# Patient Record
Sex: Male | Born: 1940 | Race: White | Hispanic: No | Marital: Married | Smoking: Never smoker
Health system: Southern US, Community
[De-identification: ages and names within clinical notes are randomized; demographics above are authoritative.]

## PROBLEM LIST (undated history)

## (undated) DIAGNOSIS — I499 Cardiac arrhythmia, unspecified: Secondary | ICD-10-CM

## (undated) DIAGNOSIS — J45909 Unspecified asthma, uncomplicated: Secondary | ICD-10-CM

## (undated) DIAGNOSIS — G4730 Sleep apnea, unspecified: Secondary | ICD-10-CM

## (undated) DIAGNOSIS — M1990 Unspecified osteoarthritis, unspecified site: Secondary | ICD-10-CM

## (undated) DIAGNOSIS — I10 Essential (primary) hypertension: Secondary | ICD-10-CM

## (undated) DIAGNOSIS — Z955 Presence of coronary angioplasty implant and graft: Secondary | ICD-10-CM

## (undated) DIAGNOSIS — I4891 Unspecified atrial fibrillation: Secondary | ICD-10-CM

## (undated) DIAGNOSIS — E785 Hyperlipidemia, unspecified: Secondary | ICD-10-CM

## (undated) DIAGNOSIS — I219 Acute myocardial infarction, unspecified: Secondary | ICD-10-CM

## (undated) HISTORY — DX: Acute myocardial infarction, unspecified: I21.9

## (undated) HISTORY — DX: Hyperlipidemia, unspecified: E78.5

## (undated) HISTORY — DX: Essential (primary) hypertension: I10

## (undated) HISTORY — DX: Sleep apnea, unspecified: G47.30

## (undated) HISTORY — DX: Unspecified atrial fibrillation: I48.91

## (undated) HISTORY — DX: Presence of coronary angioplasty implant and graft: Z95.5

## (undated) HISTORY — PX: CARDIAC SURGERY: SHX584

## (undated) HISTORY — DX: Unspecified asthma, uncomplicated: J45.909

## (undated) HISTORY — PX: CORONARY ARTERY BYPASS GRAFT: SHX141

## (undated) HISTORY — DX: Unspecified osteoarthritis, unspecified site: M19.90

---

## 1979-08-16 DIAGNOSIS — C4490 Unspecified malignant neoplasm of skin, unspecified: Secondary | ICD-10-CM

## 1979-08-16 HISTORY — DX: Unspecified malignant neoplasm of skin, unspecified: C44.90

## 1995-12-16 DIAGNOSIS — Z5189 Encounter for other specified aftercare: Secondary | ICD-10-CM

## 1995-12-16 HISTORY — DX: Encounter for other specified aftercare: Z51.89

## 1995-12-16 HISTORY — PX: CHOLECYSTECTOMY: SHX55

## 1996-08-23 ENCOUNTER — Inpatient Hospital Stay
Admission: AD | Admit: 1996-08-23 | Disposition: A | Discharge status: Home / Self Care | Payer: Self-pay | Source: Ambulatory Visit | Admitting: Cardiology

## 1996-09-30 ENCOUNTER — Emergency Department: Admit: 1996-09-30 | Payer: Self-pay | Admitting: Surgery

## 1996-10-01 ENCOUNTER — Inpatient Hospital Stay
Admission: EM | Admit: 1996-10-01 | Disposition: A | Discharge status: Home / Self Care | Payer: Self-pay | Admitting: Surgery

## 1996-11-07 ENCOUNTER — Ambulatory Visit: Admission: RE | Admit: 1996-11-07 | Payer: Self-pay | Source: Ambulatory Visit | Admitting: Gastroenterology

## 1996-11-28 ENCOUNTER — Ambulatory Visit
Admission: RE | Admit: 1996-11-28 | Disposition: A | Discharge status: Home / Self Care | Payer: Self-pay | Source: Ambulatory Visit | Admitting: Specialist

## 1997-02-03 ENCOUNTER — Ambulatory Visit: Admit: 1997-02-03 | Disposition: A | Discharge status: Home / Self Care | Payer: Self-pay | Admitting: Internal Medicine

## 1999-11-16 ENCOUNTER — Ambulatory Visit: Admit: 1999-11-16 | Disposition: A | Discharge status: Home / Self Care | Payer: Self-pay | Admitting: Adult Health

## 1999-11-19 ENCOUNTER — Emergency Department: Admit: 1999-11-19 | Payer: Self-pay | Admitting: Emergency Medicine

## 1999-11-21 ENCOUNTER — Ambulatory Visit
Admit: 1999-11-21 | Disposition: A | Discharge status: Home / Self Care | Payer: Self-pay | Admitting: Physical Medicine & Rehabilitation

## 1999-11-22 ENCOUNTER — Ambulatory Visit: Admit: 1999-11-22 | Disposition: A | Discharge status: Home / Self Care | Payer: Self-pay

## 1999-12-02 ENCOUNTER — Ambulatory Visit: Admit: 1999-12-02 | Disposition: A | Discharge status: Home / Self Care | Payer: Self-pay

## 2003-04-26 ENCOUNTER — Ambulatory Visit
Admission: RE | Admit: 2003-04-26 | Disposition: A | Discharge status: Home / Self Care | Payer: Self-pay | Source: Ambulatory Visit | Admitting: Gastroenterology

## 2004-09-05 ENCOUNTER — Ambulatory Visit
Admission: AD | Admit: 2004-09-05 | Disposition: A | Discharge status: Home / Self Care | Payer: Self-pay | Source: Ambulatory Visit | Admitting: Internal Medicine

## 2004-09-14 ENCOUNTER — Ambulatory Visit
Admission: AD | Admit: 2004-09-14 | Disposition: A | Discharge status: Home / Self Care | Payer: Self-pay | Source: Ambulatory Visit | Admitting: Internal Medicine

## 2005-12-15 DIAGNOSIS — E119 Type 2 diabetes mellitus without complications: Secondary | ICD-10-CM

## 2005-12-15 HISTORY — PX: COLONOSCOPY: SHX174

## 2005-12-15 HISTORY — DX: Type 2 diabetes mellitus without complications: E11.9

## 2006-12-23 ENCOUNTER — Ambulatory Visit
Admission: RE | Admit: 2006-12-23 | Disposition: A | Discharge status: Home / Self Care | Payer: Self-pay | Source: Ambulatory Visit | Admitting: Internal Medicine

## 2008-12-15 DIAGNOSIS — N200 Calculus of kidney: Secondary | ICD-10-CM

## 2008-12-15 HISTORY — PX: OTHER SURGICAL HISTORY: SHX169

## 2008-12-15 HISTORY — PX: JOINT REPLACEMENT: SHX530

## 2008-12-15 HISTORY — DX: Calculus of kidney: N20.0

## 2009-03-23 ENCOUNTER — Inpatient Hospital Stay
Admission: RE | Admit: 2009-03-23 | Disposition: A | Discharge status: Home / Self Care | Payer: Self-pay | Source: Ambulatory Visit | Admitting: Orthopaedic Surgery

## 2009-03-26 LAB — CBC AND DIFFERENTIAL
BASOPHILS %: 0.2 % (ref 0.0–2.0)
BASOPHILS %: 0.3 % (ref 0.0–2.0)
Baso(Absolute): 0.02 10*3/uL (ref 0.00–0.20)
Baso(Absolute): 0.03 10*3/uL (ref 0.00–0.20)
Eosinophils %: 0.1 % (ref 0.0–6.0)
Eosinophils %: 0.6 % (ref 0.0–6.0)
Eosinophils Absolute: 0.01 10*3/uL — ABNORMAL LOW (ref 0.10–0.30)
Eosinophils Absolute: 0.07 10*3/uL — ABNORMAL LOW (ref 0.10–0.30)
Hematocrit: 37.7 % — ABNORMAL LOW (ref 39.0–49.0)
Hematocrit: 34.6 % — ABNORMAL LOW (ref 39.0–49.0)
Hemoglobin: 12.9 g/dL — ABNORMAL LOW (ref 13.2–17.3)
Hemoglobin: 11.7 g/dL — ABNORMAL LOW (ref 13.2–17.3)
Immature Granulocytes #: 0.02 10*3/uL (ref 0.00–0.05)
Immature Granulocytes #: 0.03 10*3/uL (ref 0.00–0.05)
Immature Granulocytes %: 0.2 % — ABNORMAL HIGH (ref 0.0–0.0)
Immature Granulocytes %: 0.3 % — ABNORMAL HIGH (ref 0.0–0.0)
Lymphocytes Absolute: 1.48 10*3/uL (ref 1.00–4.80)
Lymphocytes Absolute: 2.75 10*3/uL (ref 1.00–4.80)
Lymphocytes Relative: 14.3 % — ABNORMAL LOW (ref 25.0–55.0)
Lymphocytes Relative: 23.7 % — ABNORMAL LOW (ref 25.0–55.0)
MCH: 30.6 pg (ref 27.0–34.0)
MCH: 30.5 pg (ref 27.0–34.0)
MCHC: 34.2 g/dL (ref 32.0–36.0)
MCHC: 33.8 g/dL (ref 32.0–36.0)
MCV: 89.3 fL (ref 80–100)
MCV: 90.3 fL (ref 80–100)
MPV: 9.5 fL (ref 9.0–13.0)
MPV: 9.8 fL (ref 9.0–13.0)
Monocytes Absolute: 1.02 10*3/uL (ref 0.10–1.20)
Monocytes Absolute: 1.48 10*3/uL — ABNORMAL HIGH (ref 0.10–1.20)
Monocytes Relative %: 9.9 % — ABNORMAL HIGH (ref 1.0–8.0)
Monocytes Relative %: 12.7 % — ABNORMAL HIGH (ref 1.0–8.0)
Neutrophils Absolute: 7.82 10*3/uL — ABNORMAL HIGH (ref 1.80–7.70)
Neutrophils Absolute: 7.29 10*3/uL (ref 1.80–7.70)
Neutrophils Relative %: 75.5 % — ABNORMAL HIGH (ref 49.0–69.0)
Neutrophils Relative %: 62.7 % (ref 49.0–69.0)
Nucleated RBC %: 0 /100WBC (ref 0.0–0.0)
Nucleated RBC %: 0 /100WBC (ref 0.0–0.0)
Nucleted RBC #: 0 10*3/uL (ref 0.00–0.00)
Nucleted RBC #: 0 10*3/uL (ref 0.00–0.00)
Platelets: 239 10*3/uL (ref 150–400)
Platelets: 226 10*3/uL (ref 150–400)
RBC: 4.22 M/uL (ref 3.80–5.40)
RBC: 3.83 M/uL (ref 3.80–5.40)
RDW: 12.9 % (ref 11.0–14.0)
RDW: 13 % (ref 11.0–14.0)
WBC: 10.35 10*3/uL (ref 4.80–10.80)
WBC: 11.62 10*3/uL — ABNORMAL HIGH (ref 4.80–10.80)

## 2009-03-26 LAB — BASIC METABOLIC PANEL
BUN / Creatinine Ratio: 21 — ABNORMAL HIGH (ref 8–20)
BUN: 21 mg/dL — ABNORMAL HIGH (ref 6–20)
CO2: 30 mmol/L (ref 21.0–31.0)
Calcium: 8.3 mg/dL — ABNORMAL LOW (ref 8.4–10.2)
Chloride: 96 mmol/L — ABNORMAL LOW (ref 101–111)
Creatinine: 0.99 mg/dL (ref 0.66–1.25)
EGFR: >60 mL/min/{1.73_m2}
EGFR: >60 mL/min/{1.73_m2}
Glucose: 154 mg/dL — ABNORMAL HIGH (ref 70–100)
Potassium: 3.8 mmol/L (ref 3.6–5.0)
Sodium: 133 mmol/L — ABNORMAL LOW (ref 135–145)

## 2009-03-26 LAB — COMPREHENSIVE METABOLIC PANEL
ALT: 34 U/L (ref 7–56)
AST (SGOT): 25 U/L (ref 5–40)
Albumin, Synovial: 3.7 g/dL — ABNORMAL LOW (ref 3.9–5.0)
Alkaline Phosphatase: 75 U/L (ref 38–126)
BUN / Creatinine Ratio: 15 (ref 8–20)
BUN: 13 mg/dL (ref 6–20)
Bilirubin, Total: 1 mg/dL (ref 0.2–1.3)
CO2: 29 mmol/L (ref 21.0–31.0)
Calcium: 8.5 mg/dL (ref 8.4–10.2)
Chloride: 99 mmol/L — ABNORMAL LOW (ref 101–111)
Creatinine: 0.92 mg/dL (ref 0.66–1.25)
EGFR: >60 mL/min/{1.73_m2}
EGFR: >60 mL/min/{1.73_m2}
Glucose: 153 mg/dL — ABNORMAL HIGH (ref 70–100)
Potassium: 3.8 mmol/L (ref 3.6–5.0)
Protein, Total: 6.5 g/dL (ref 6.3–8.2)
Sodium: 135 mmol/L (ref 135–145)

## 2009-03-26 LAB — ABO/RH: ABO Rh: O NEG

## 2009-03-26 LAB — ANTIBODY SCREEN: AB Screen Gel: NEGATIVE

## 2009-12-15 DIAGNOSIS — I2510 Atherosclerotic heart disease of native coronary artery without angina pectoris: Secondary | ICD-10-CM

## 2009-12-15 HISTORY — DX: Atherosclerotic heart disease of native coronary artery without angina pectoris: I25.10

## 2010-11-14 HISTORY — PX: CORONARY ANGIOPLASTY WITH STENT PLACEMENT: SHX49

## 2010-11-17 ENCOUNTER — Emergency Department
Admission: EM | Admit: 2010-11-17 | Disposition: A | Discharge status: Home / Self Care | Payer: Self-pay | Source: Emergency Department | Admitting: Emergency Medicine

## 2010-11-20 LAB — COMPREHENSIVE METABOLIC PANEL
ALT: 26 U/L (ref 7–56)
AST (SGOT): 21 U/L (ref 5–40)
Albumin, Synovial: 4 g/dL (ref 3.9–5.0)
Alkaline Phosphatase: 78 U/L (ref 38–126)
BUN / Creatinine Ratio: 15 (ref 8–20)
BUN: 26 mg/dL — ABNORMAL HIGH (ref 6–20)
Bilirubin, Total: 0.6 mg/dL (ref 0.2–1.3)
CO2: 27 mmol/L (ref 21.0–31.0)
Calcium: 9.3 mg/dL (ref 8.4–10.2)
Chloride: 99 mmol/L — ABNORMAL LOW (ref 101–111)
Creatinine: 1.68 mg/dL — ABNORMAL HIGH (ref 0.66–1.25)
EGFR: 40 mL/min/{1.73_m2}
EGFR: 49 mL/min/{1.73_m2}
Glucose: 141 mg/dL — ABNORMAL HIGH (ref 70–100)
Potassium: 4 mmol/L (ref 3.6–5.0)
Protein, Total: 6.9 g/dL (ref 6.3–8.2)
Sodium: 135 mmol/L (ref 135–145)

## 2010-11-20 LAB — CBC AND DIFFERENTIAL
BASOPHILS %: 0.3 % (ref 0.0–2.0)
Baso(Absolute): 0.04 10*3/uL (ref 0.00–0.20)
Eosinophils %: 0.8 % (ref 0.0–6.0)
Eosinophils Absolute: 0.1 10*3/uL (ref 0.10–0.30)
Hematocrit: 40.3 % (ref 39.0–49.0)
Hemoglobin: 14 g/dL (ref 13.2–17.3)
Immature Granulocytes #: 0.03 10*3/uL (ref 0.00–0.05)
Immature Granulocytes %: 0.2 % — ABNORMAL HIGH (ref 0.0–0.0)
Lymphocytes Absolute: 1.84 10*3/uL (ref 1.00–4.80)
Lymphocytes Relative: 14.8 % — ABNORMAL LOW (ref 25.0–55.0)
MCH: 30.4 pg (ref 27.0–34.0)
MCHC: 34.7 g/dL (ref 32.0–36.0)
MCV: 87.6 fL (ref 80–100)
MPV: 9.6 fL (ref 9.0–13.0)
Monocytes Absolute: 0.87 10*3/uL (ref 0.10–1.20)
Monocytes Relative %: 7 % (ref 1.0–8.0)
Neutrophils Absolute: 9.55 10*3/uL — ABNORMAL HIGH (ref 1.80–7.70)
Neutrophils Relative %: 77.1 % — ABNORMAL HIGH (ref 49.0–69.0)
Nucleated RBC %: 0 /100WBC (ref 0.0–0.0)
Nucleted RBC #: 0 10*3/uL (ref 0.00–0.00)
Platelets: 316 10*3/uL (ref 150–400)
RBC: 4.6 M/uL (ref 3.80–5.40)
RDW: 13.6 % (ref 11.0–14.0)
WBC: 12.4 10*3/uL — ABNORMAL HIGH (ref 4.80–10.80)

## 2010-11-20 LAB — URINALYSIS
Bilirubin, UA: NEGATIVE
Glucose, UA: NEGATIVE
Ketones UA: NEGATIVE
Leukocyte Esterase, UA: NEGATIVE
Nitrate: NEGATIVE
Protein, UR: NEGATIVE
Specific Gravity, UR: 1.025 (ref 1.000–1.035)
Urobilinogen, UA: NORMAL
pH, Urine: 6 (ref 5.0–8.0)

## 2010-11-20 LAB — URINALYSIS WITH MICROSCOPIC

## 2011-03-21 ENCOUNTER — Inpatient Hospital Stay
Admission: RE | Admit: 2011-03-21 | Disposition: A | Discharge status: Home / Self Care | Payer: Self-pay | Source: Ambulatory Visit | Attending: Cardiovascular Disease | Admitting: Cardiovascular Disease

## 2011-04-15 ENCOUNTER — Inpatient Hospital Stay
Admission: RE | Admit: 2011-04-15 | Disposition: A | Discharge status: Home / Self Care | Payer: Self-pay | Source: Ambulatory Visit | Attending: Cardiovascular Disease | Admitting: Cardiovascular Disease

## 2011-05-16 ENCOUNTER — Inpatient Hospital Stay
Admission: RE | Admit: 2011-05-16 | Disposition: A | Discharge status: Home / Self Care | Payer: Self-pay | Source: Ambulatory Visit | Attending: Cardiovascular Disease | Admitting: Cardiovascular Disease

## 2011-06-09 IMAGING — CR DG CHEST 2V
2 series · 2 of 2 positions shown · non-contrast
Comparison: None.

CLINICAL DATA: Chest pain

CHEST - 2 VIEW

[w chest pa *]
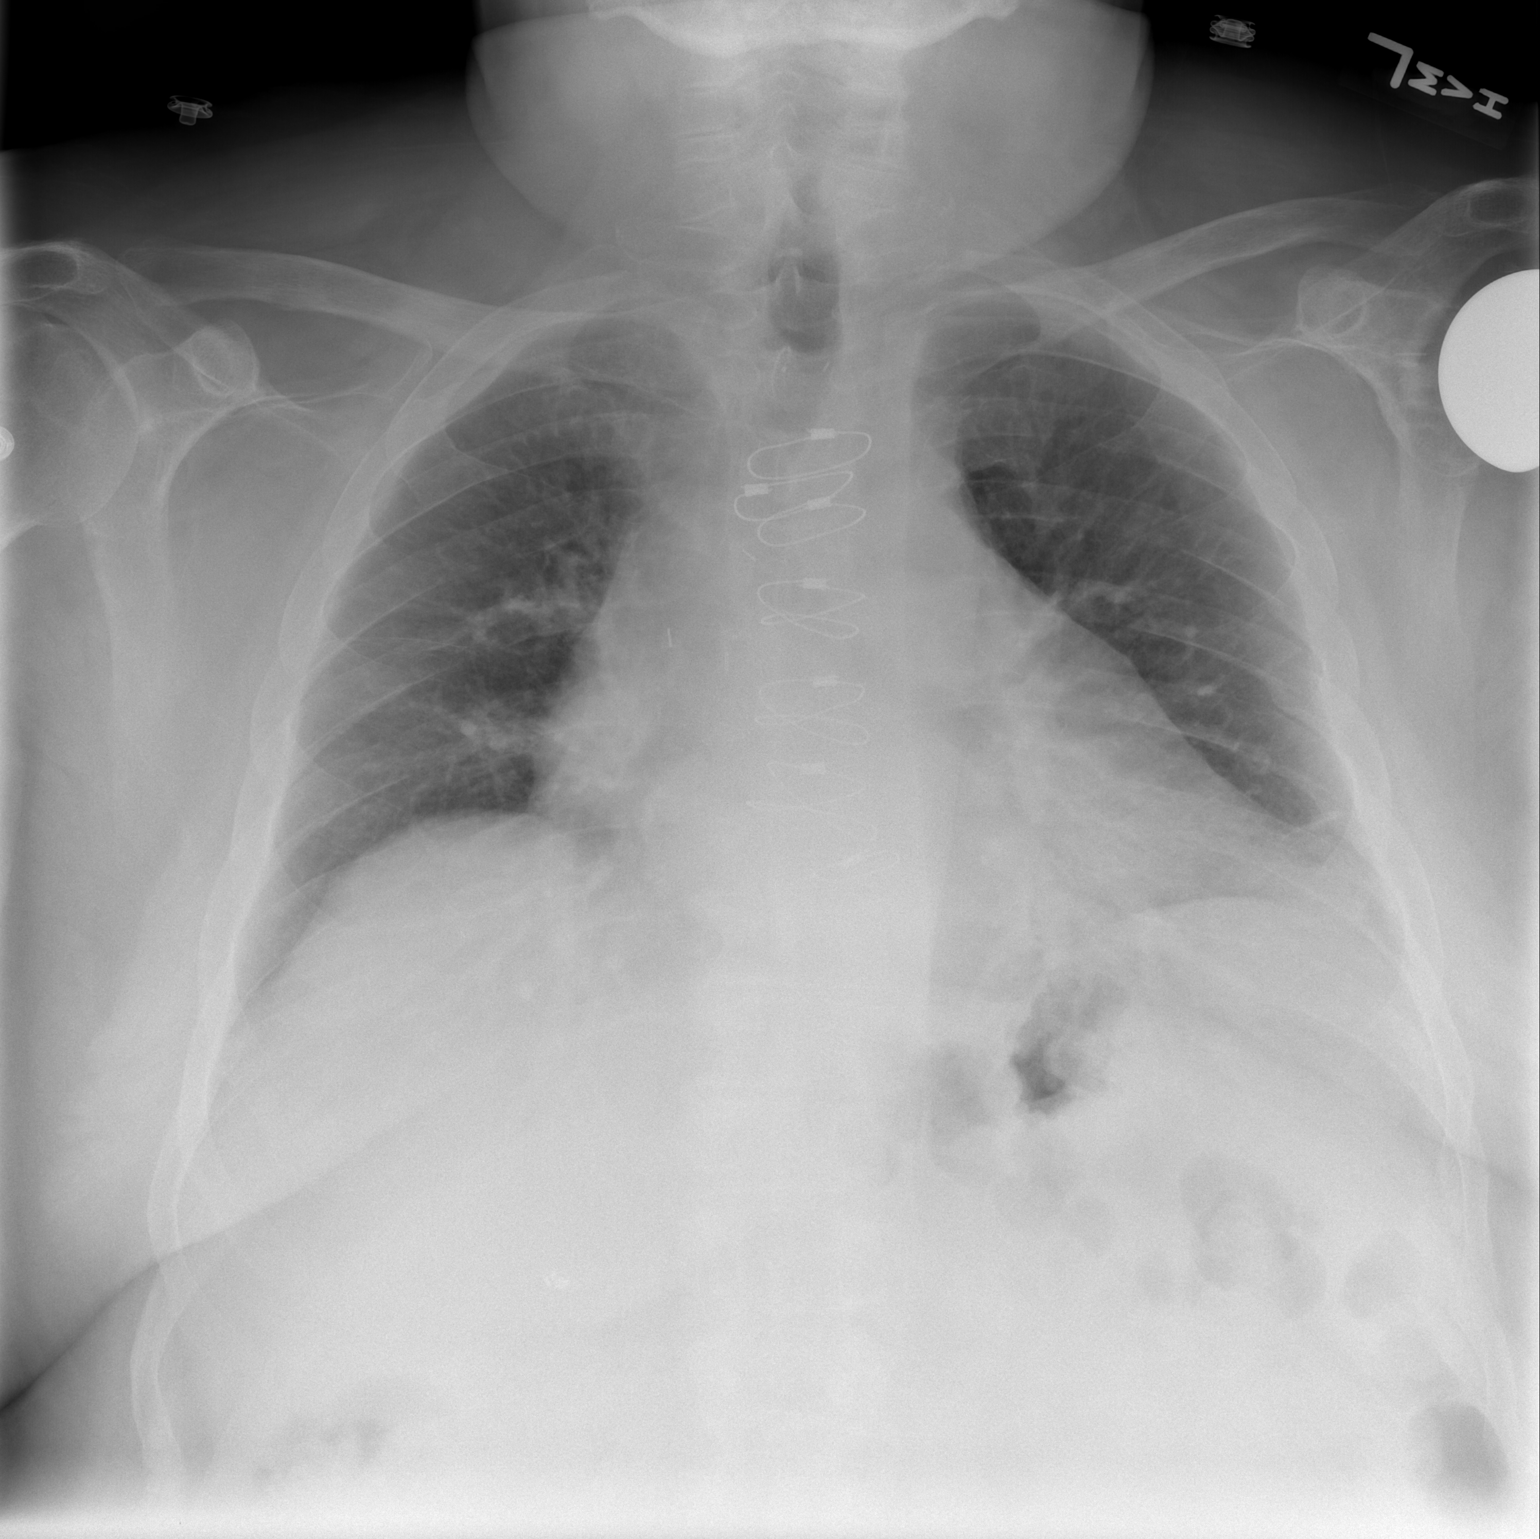

[w chest lat *]
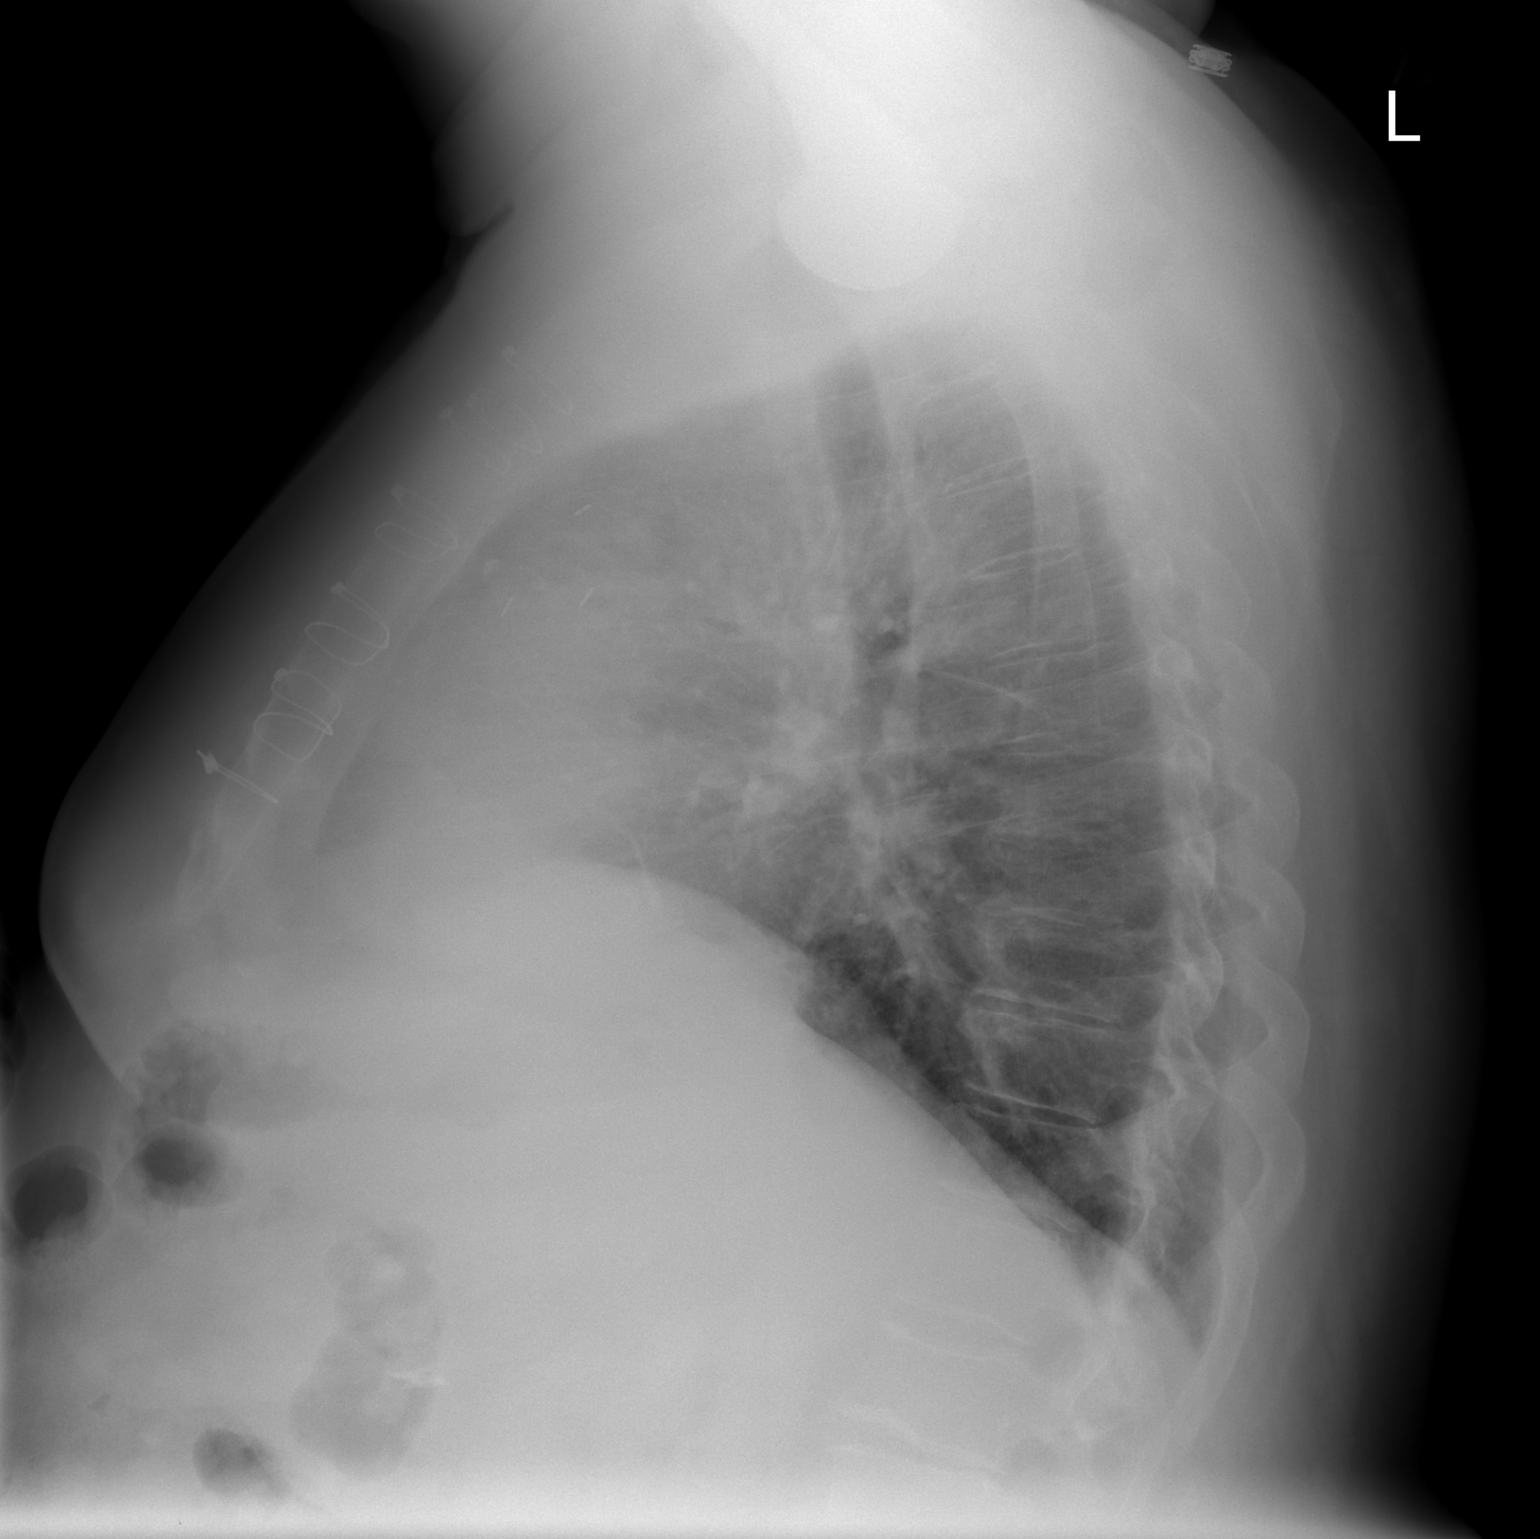

[2 of 2 positions shown; findings below may reference images not displayed]

FINDINGS: Previous coronary bypass changes and left shoulder
arthroplasty.  Cardiac silhouette is enlarged with decreased lung
volumes.  Scattered perihilar and lower lobe atelectasis.  Negative
for CHF, pneumonia, effusion or pneumothorax.  Slight degenerative
changes of the spine.  Previous cholecystectomy noted.
IMPRESSION: Cardiomegaly with scattered atelectasis and low lung volumes.

## 2011-06-10 IMAGING — CT CT ANGIO CHEST
2 of 6 series · 18 of 36 positions shown · IV contrast (APPLIED)
Comparison: Chest radiograph performed 12/07/2010

CLINICAL DATA: Chest and back pressure with exertion for 2 weeks.
Recently status post stem cell procedure at the left knee.

CT ANGIOGRAPHY CHEST WITH CONTRAST
TECHNIQUE: Multidetector CT imaging of the chest was performed
using the standard protocol during bolus administration of
intravenous contrast.  Multiplanar CT image reconstructions
including MIPs were obtained to evaluate the vascular anatomy.
Contrast:  100 mL of Omnipaque 300 IV contrast

[Series 6: thins · axial · 0.80mm/px · z∈[-312,-119]mm · 17 of 215 slices shown]
[im 11/215  lung]
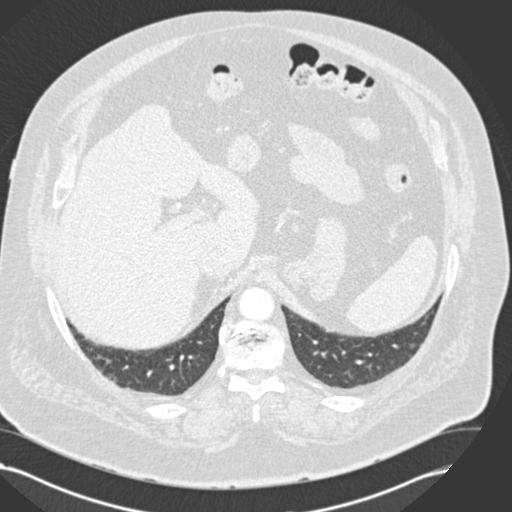
[im 22/215  mediastinal]
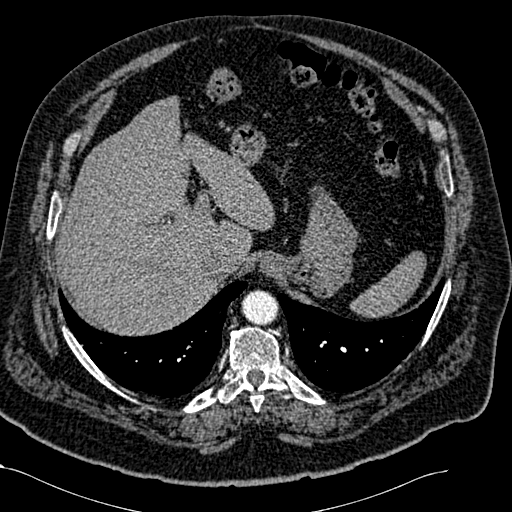
[im 33/215  lung]
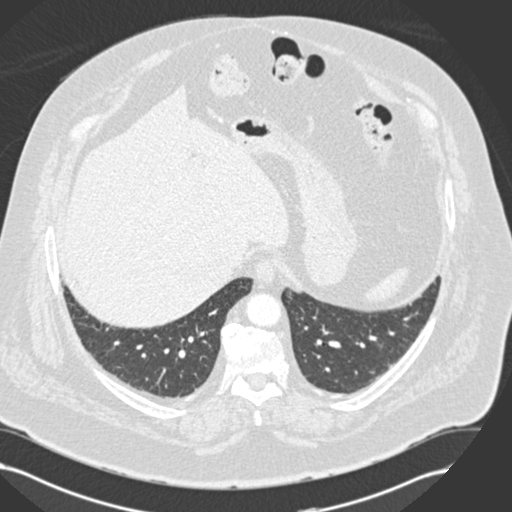
[im 43/215  mediastinal]
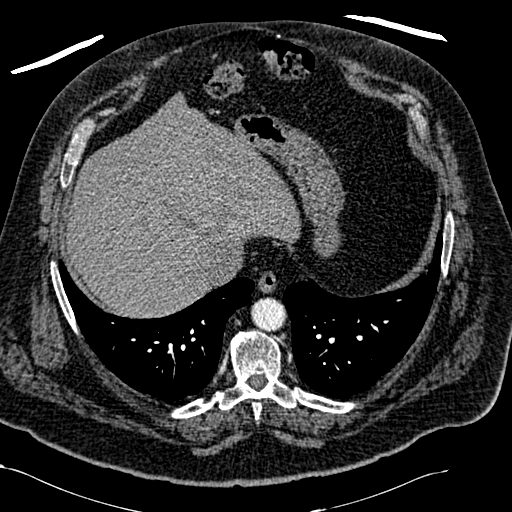
[im 65/215  lung]
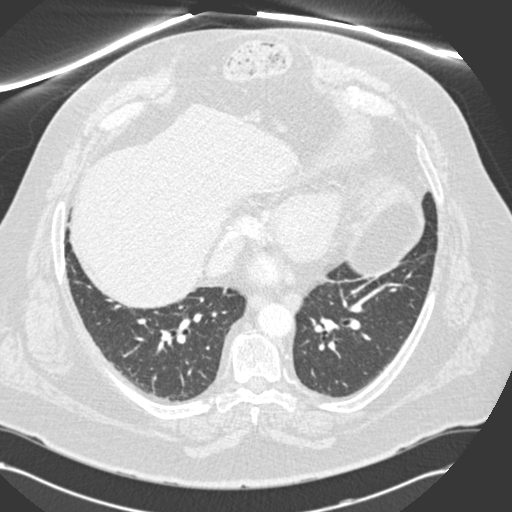
[im 75/215  mediastinal]
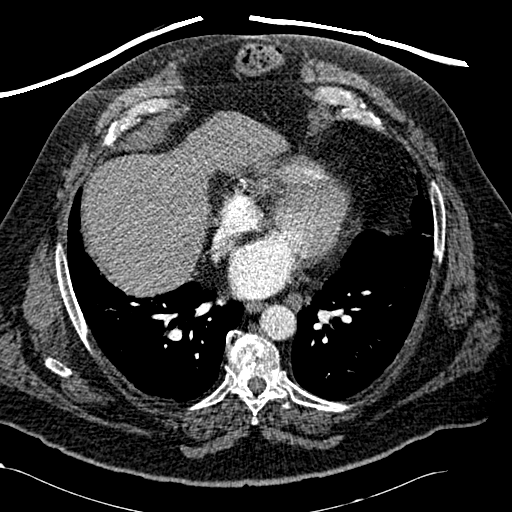
[im 86/215  lung]
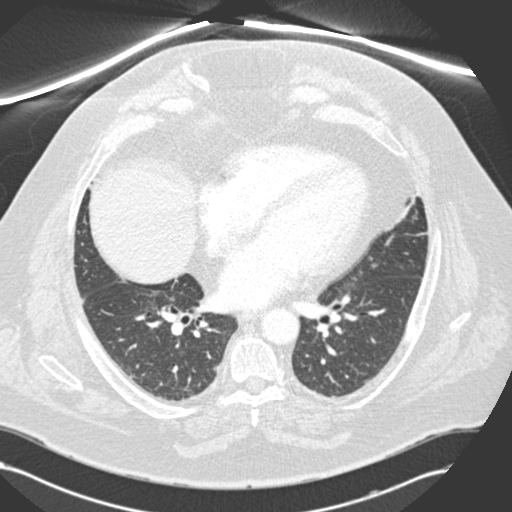
[im 97/215  mediastinal]
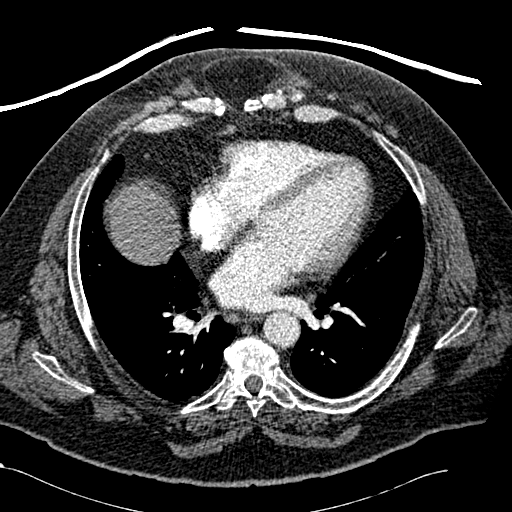
[im 108/215  lung]
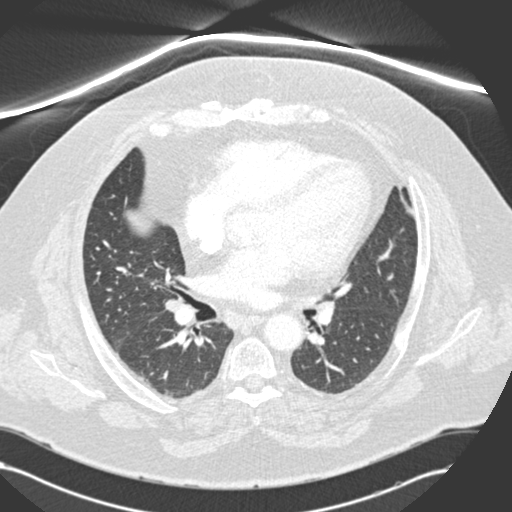
[im 118/215  mediastinal]
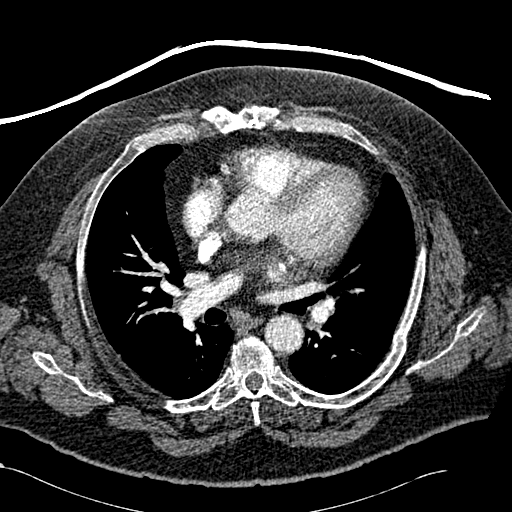
[im 129/215  lung]
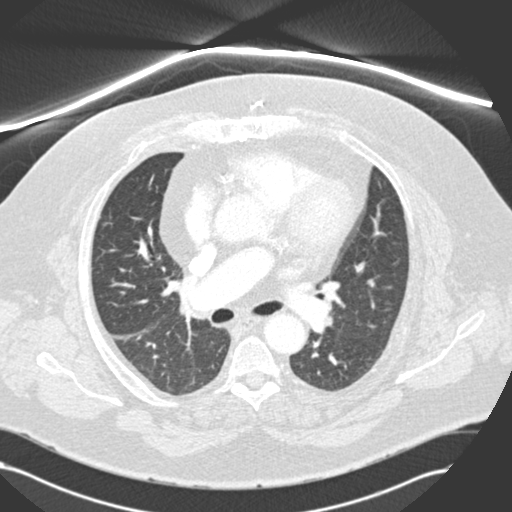
[im 140/215  mediastinal]
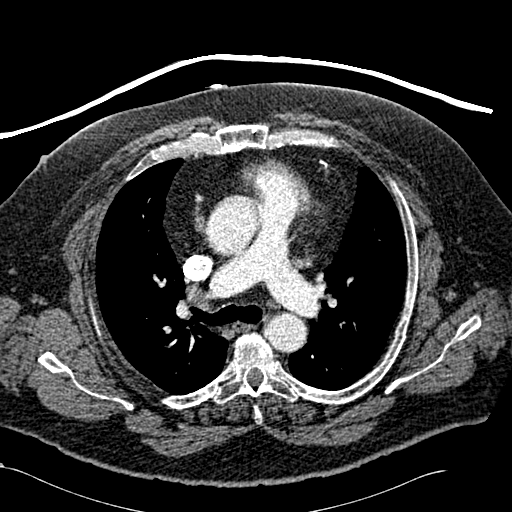
[im 150/215  lung]
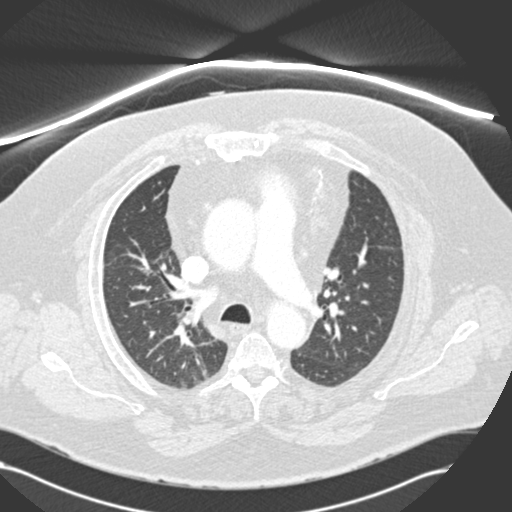
[im 172/215  mediastinal]
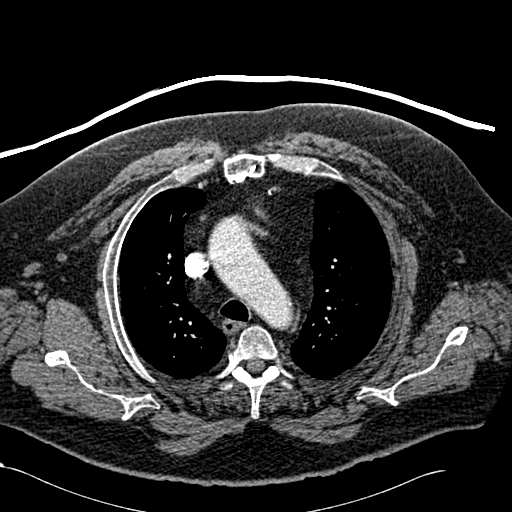
[im 182/215  lung]
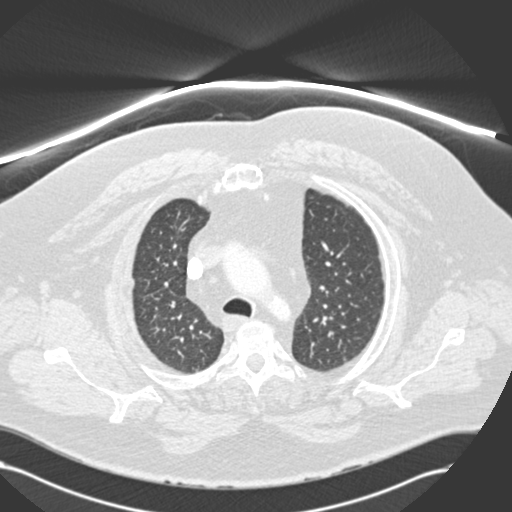
[im 193/215  mediastinal]
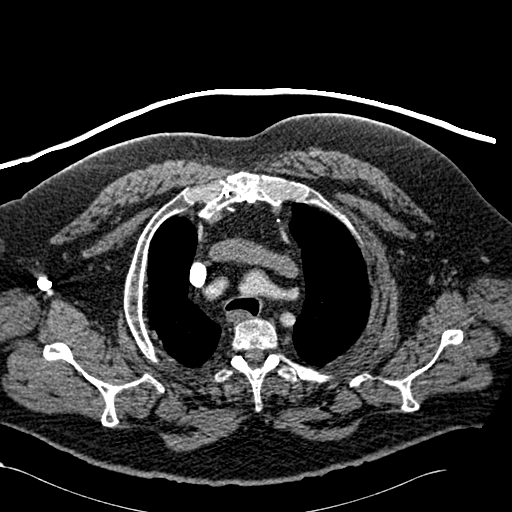
[im 204/215  lung]
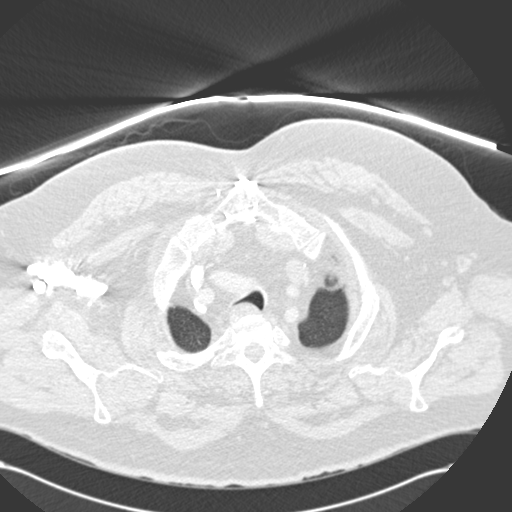

[Series 602: coronal chest · coronal · 0.80mm/px · 1 of 112 slices shown]
[im 56/112  mediastinal]
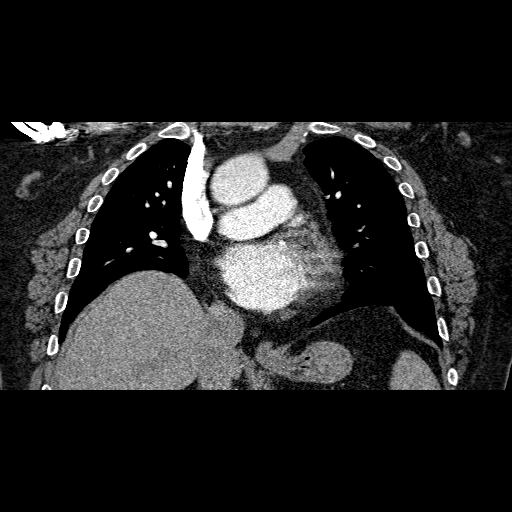

[18 of 36 positions shown; findings below may reference images not displayed]

FINDINGS: There is no evidence of significant pulmonary embolus.
Evaluation for pulmonary embolus is suboptimal given limitations in
the timing of the contrast bolus.

Mild bilateral atelectasis is noted.  The lungs are otherwise
clear, though the lung bases are incompletely imaged on this study.
There is no evidence of significant focal consolidation, pleural
effusion or pneumothorax.  No masses are identified; no abnormal
focal contrast enhancement is seen.

Scattered borderline prominent right peribronchial and right hilar
nodes are noted, measuring up to 1.0 cm in short axis.  A few
smaller left peribronchial nodes are seen.

Scattered small right paratracheal and periaortic nodes are noted;
a single prominent 1.2 cm node is noted at the azygoesophageal
recess.  The mediastinum is otherwise unremarkable in appearance.
Scattered coronary artery calcifications are seen.  No pericardial
effusion is identified.  The great vessels are unremarkable in
appearance; a bovine aortic arch is incidentally noted.  No
axillary lymphadenopathy is seen.  The minimally visualized
portions of the thyroid gland are unremarkable in appearance.

The visualized portions of the liver and spleen are unremarkable.
The patient is status post cholecystectomy, with clips noted at the
gallbladder fossa.  The patient is status post median sternotomy,
with union of the sternum.

There is mild superior herniation of fat adjacent to the xiphoid
process, along the anterior abdominal wall.

No acute osseous abnormalities are seen.  Mild vacuum phenomenon is
noted along the thoracic spine.

Review of the MIP images confirms the above findings.
IMPRESSION: 1.  No evidence of significant pulmonary embolus.

2.  Mild bilateral atelectasis; visualized lungs otherwise clear.
3.  Borderline prominent right peribronchial and right hilar nodes,
measuring up to 1.0 cm in short axis; prominent 1.2 cm node noted
at the azygoesophageal recess.
4.  Scattered coronary artery calcifications seen.
5.  Mild superior herniation of fat adjacent to the xiphoid
process, along the anterior abdominal wall.

## 2011-06-15 ENCOUNTER — Inpatient Hospital Stay
Admission: RE | Admit: 2011-06-15 | Disposition: A | Discharge status: Home / Self Care | Payer: Self-pay | Source: Ambulatory Visit | Attending: Cardiovascular Disease | Admitting: Cardiovascular Disease

## 2011-06-26 ENCOUNTER — Emergency Department
Admit: 2011-06-26 | Disposition: A | Discharge status: Home / Self Care | Payer: Self-pay | Source: Emergency Department | Admitting: Emergency Medicine

## 2011-07-16 ENCOUNTER — Inpatient Hospital Stay
Admission: RE | Admit: 2011-07-16 | Disposition: A | Discharge status: Home / Self Care | Payer: Self-pay | Source: Ambulatory Visit | Attending: Cardiovascular Disease | Admitting: Cardiovascular Disease

## 2011-08-16 ENCOUNTER — Inpatient Hospital Stay
Admission: RE | Admit: 2011-08-16 | Disposition: A | Discharge status: Home / Self Care | Payer: Self-pay | Source: Ambulatory Visit | Attending: Cardiovascular Disease | Admitting: Cardiovascular Disease

## 2011-09-15 ENCOUNTER — Inpatient Hospital Stay
Admission: RE | Admit: 2011-09-15 | Disposition: A | Discharge status: Home / Self Care | Payer: Self-pay | Source: Ambulatory Visit | Attending: Cardiovascular Disease | Admitting: Cardiovascular Disease

## 2011-09-17 NOTE — Consults (Signed)
Travis Hogan, Travis Hogan                                                    MRN:          [id number]                                                          Account:      [id number]                                                     Document ID:  [id number]                                               Service Date: 03/23/2009                                                                                    MRN: [id number]  Admit Date: 03/23/2009     Patient Location: [id number]   Patient Type: I     CONSULTING PHYSICIAN: Majel Homer MD     REFERRING PHYSICIAN:         HISTORY OF PRESENT ILLNESS:  This is a 70 year old gentleman with history of coronary artery disease,  hypertension, diabetes, who was admitted for left shoulder elective total  arthroplasty.  The patient denies any headaches, any trouble with vision,  denies any cardiopulmonary symptoms, any shortness of breath, and denies  any GI or GU symptoms.     PAST MEDICAL HISTORY:  History of diabetes, hypertension, obstructive sleep apnea, hyperlipidemia,  hypertension, coronary artery disease.     PAST SURGICAL HISTORY:  Cholecystectomy, coronary artery bypass x2.     ALLERGIES:  No known drug allergies.     MEDICATIONS:  As per MAR.     SOCIAL HISTORY:  No smoking, no alcohol.     FAMILY HISTORY:  Reviewed, noncontributory.     PHYSICAL EXAMINATION:  VITAL SIGNS:  Blood pressure 136/74, pulse of 91, oxygen saturation 94% on  2 liters nasal cannula, respirations 18, afebrile.  GENERAL:  A moderately-increased body habitus gentleman in no acute  distress, awake and alert.  HEENT:  Conjunctivae pink, sclerae are anicteric.  NECK:  No JVD, no thyromegaly.  LUNGS:  Clear bilaterally.  No rhonchi, no wheezing.  Page 1 of 2  Travis Hogan, Travis Hogan                                                    MRN:          [id number]                                                           Account:      [id number]                                                     Document ID:  [id number]                                               Service Date: 03/23/2009                                                                                    CARDIOVASCULAR:  S1, S2, regular.  No murmur, no gallop.  ABDOMEN:  Soft, nontender.  Bowel sounds are present.  EXTREMITIES:  No edema, no clubbing, no cyanosis.     LABORATORY DATA:  Within normal range.     ASSESSMENT AND PLAN:  This is a 70 year old gentleman who was admitted status post left total  shoulder arthroplasty with diabetes, hypertension, hyperlipidemia, and  obstructive sleep apnea.  We will resume his medication, insulin sliding  scale, CPAP to be used at night.              D:  03/23/2009 12:26 PM by Majel Homer, MD [phone])  T:  03/23/2009 19:21 PM by Nada Maclachlan        cc: Trellis Moment MD                                                                                                           Page 2 of 2  Authenticated by Majel Homer, MD On 03/26/2009 05:01:35 PM

## 2011-10-16 ENCOUNTER — Inpatient Hospital Stay
Admission: RE | Admit: 2011-10-16 | Disposition: A | Discharge status: Home / Self Care | Payer: Self-pay | Source: Ambulatory Visit | Attending: Cardiovascular Disease | Admitting: Cardiovascular Disease

## 2011-11-15 ENCOUNTER — Inpatient Hospital Stay
Admission: RE | Admit: 2011-11-15 | Disposition: A | Discharge status: Home / Self Care | Payer: Self-pay | Source: Ambulatory Visit | Attending: Cardiovascular Disease | Admitting: Cardiovascular Disease

## 2012-08-17 ENCOUNTER — Ambulatory Visit: Payer: Medicare Other

## 2012-08-17 NOTE — Pre-Procedure Instructions (Signed)
DOS SEPT 30 0930 ARRIVE 0800. PT WILL CK WITH CARDIO IF HE CAN STOP ASA. FOLLOW PMD  ORDER RE DIABETIC MED. PT SAYS HE IS A VERY HARD STICK.

## 2012-08-27 NOTE — Anesthesia Preprocedure Evaluation (Addendum)
Anesthesia Evaluation    AIRWAY    Mallampati: III    TM distance: >3 FB  Neck ROM: full  Mouth Opening:full   CARDIOVASCULAR    cardiovascular exam normal, regular and normal     DENTAL    No notable dental hx     PULMONARY    pulmonary exam normal and clear to auscultation     OTHER FINDINGS              Anesthesia Plan    ASA 3   general   Detailed anesthesia plan: general IV      Post op pain management: per surgeon(SR, LVH, diastolic dysfun    ECHO nl perfusion, EF  )  intravenous induction   informed consent obtained             Allergies:  No Known Allergies  All Rx:  Scheduled Meds:     Continuous Infusions:     PRN Meds:.     Problem List:  There are no active problems to display for this patient.    History:  Past Medical History   Diagnosis Date   . Hypertensive disorder    . Coronary artery disease 2011     CARDIAC STENT X 2   . Sleep apnea    . Asthma without status asthmaticus      FOLLOWED BY DR DSEVAN-CONTROLLED ASTHMA   . Diabetes mellitus type II 2007     BS 110-115 TYPE 2   . Kidney stone 2010     PASSED ON OWN X 2   . Skin cancer 1980'S     BASAL CELL FOREHEAD,  SHOULDER REMOVED   . Blood transfusion without reported diagnosis 1997     OPEN HEART SX   . Arthritis      RT SHOULDER, KNEES, LT TOTAL SHOULDER, BACK   . Hyperlipidemia      Past Surgical History   Procedure Date   . Joint replacement 2010     LT SHOULDER   . Cholecystectomy 1997   . Stem cell knee 2010     STEM CELL INJ INTO LT KNEE TAKEN FROM PT'S BODY FAT   . Coronary angioplasty with stent placement DEC 2011     X2   . Cardiac surgery 1982, 1997     QUAD THEN A TRIPLE BYPASS   . Colonoscopy 2007     HX POLYPS     Labs    Lab Results   Component Value Date    WBC 12.40* 11/17/2010    HGB 14.0 11/17/2010    HCT 40.3 11/17/2010    PLT 316 11/17/2010    ALT 26 11/17/2010    AST 21 11/17/2010    NA 135 11/17/2010    K 4.0 11/17/2010    CL 99* 11/17/2010    CO2 27 11/17/2010    CREAT 1.68* 11/17/2010    BUN 26* 11/17/2010    GLU 141* 11/17/2010

## 2012-09-13 ENCOUNTER — Encounter
Admission: RE | Disposition: A | Discharge status: Home / Self Care | Payer: Self-pay | Source: Ambulatory Visit | Attending: Gastroenterology

## 2012-09-13 ENCOUNTER — Ambulatory Visit: Payer: Medicare Other | Admitting: Gastroenterology

## 2012-09-13 ENCOUNTER — Ambulatory Visit
Admission: RE | Admit: 2012-09-13 | Discharge: 2012-09-13 | Disposition: A | Discharge status: Home / Self Care | Payer: Medicare Other | Source: Ambulatory Visit | Attending: Gastroenterology | Admitting: Gastroenterology

## 2012-09-13 ENCOUNTER — Ambulatory Visit: Payer: Self-pay

## 2012-09-13 ENCOUNTER — Encounter: Payer: Self-pay | Admitting: Anesthesiology

## 2012-09-13 ENCOUNTER — Ambulatory Visit: Payer: Medicare Other | Admitting: Anesthesiology

## 2012-09-13 DIAGNOSIS — K648 Other hemorrhoids: Secondary | ICD-10-CM | POA: Insufficient documentation

## 2012-09-13 DIAGNOSIS — I2510 Atherosclerotic heart disease of native coronary artery without angina pectoris: Secondary | ICD-10-CM | POA: Insufficient documentation

## 2012-09-13 DIAGNOSIS — G4730 Sleep apnea, unspecified: Secondary | ICD-10-CM | POA: Insufficient documentation

## 2012-09-13 DIAGNOSIS — K625 Hemorrhage of anus and rectum: Secondary | ICD-10-CM | POA: Insufficient documentation

## 2012-09-13 DIAGNOSIS — K5730 Diverticulosis of large intestine without perforation or abscess without bleeding: Secondary | ICD-10-CM | POA: Insufficient documentation

## 2012-09-13 DIAGNOSIS — K621 Rectal polyp: Secondary | ICD-10-CM | POA: Insufficient documentation

## 2012-09-13 DIAGNOSIS — J45909 Unspecified asthma, uncomplicated: Secondary | ICD-10-CM | POA: Insufficient documentation

## 2012-09-13 DIAGNOSIS — E119 Type 2 diabetes mellitus without complications: Secondary | ICD-10-CM | POA: Insufficient documentation

## 2012-09-13 DIAGNOSIS — D126 Benign neoplasm of colon, unspecified: Secondary | ICD-10-CM | POA: Insufficient documentation

## 2012-09-13 DIAGNOSIS — I10 Essential (primary) hypertension: Secondary | ICD-10-CM | POA: Insufficient documentation

## 2012-09-13 DIAGNOSIS — Z85828 Personal history of other malignant neoplasm of skin: Secondary | ICD-10-CM | POA: Insufficient documentation

## 2012-09-13 DIAGNOSIS — E785 Hyperlipidemia, unspecified: Secondary | ICD-10-CM | POA: Insufficient documentation

## 2012-09-13 LAB — POCT GLUCOSE: Whole Blood Glucose POCT: 110 mg/dL — AB (ref 70–100)

## 2012-09-13 SURGERY — DONT USE, USE 1094-COLONOSCOPY, DIAGNOSTIC (SCREENING)
Anesthesia: Anesthesia General | Site: Anus | Wound class: Clean Contaminated

## 2012-09-13 MED ORDER — LIDOCAINE HCL (PF) 2 % IJ SOLN
INTRAMUSCULAR | Status: AC
Start: 2012-09-13 — End: ?
  Filled 2012-09-13: qty 1

## 2012-09-13 MED ORDER — LACTATED RINGERS IV SOLN
INTRAVENOUS | Status: DC | PRN
Start: 2012-09-13 — End: 2012-09-13

## 2012-09-13 MED ORDER — PROMETHAZINE HCL 25 MG/ML IJ SOLN
6.2500 mg | Freq: Once | INTRAMUSCULAR | Status: DC | PRN
Start: 2012-09-13 — End: 2012-09-13

## 2012-09-13 MED ORDER — LIDOCAINE 1% BUFFERED - CNR/OUTSOURCED
0.3000 mL | Freq: Once | INTRAMUSCULAR | Status: AC
Start: 2012-09-13 — End: 2012-09-13
  Administered 2012-09-13: 0.3 mL via INTRADERMAL

## 2012-09-13 MED ORDER — LACTATED RINGERS IV SOLN
INTRAVENOUS | Status: DC
Start: 2012-09-13 — End: 2012-09-13

## 2012-09-13 MED ORDER — PROPOFOL 10 MG/ML IV EMUL
INTRAVENOUS | Status: AC
Start: 2012-09-13 — End: ?
  Filled 2012-09-13: qty 50

## 2012-09-13 MED ORDER — PROPOFOL 10 MG/ML IV EMUL
INTRAVENOUS | Status: DC | PRN
Start: 2012-09-13 — End: 2012-09-13
  Administered 2012-09-13: 5 mL via INTRAVENOUS

## 2012-09-13 MED ORDER — LACTATED RINGERS IV SOLN
INTRAVENOUS | Status: DC
Start: 2012-09-13 — End: 2012-09-13
  Administered 2012-09-13: 1000 mL via INTRAVENOUS

## 2012-09-13 MED ORDER — PROPOFOL 10 MG/ML IV EMUL
INTRAVENOUS | Status: AC
Start: 2012-09-13 — End: ?
  Filled 2012-09-13: qty 2

## 2012-09-13 MED ORDER — MIDAZOLAM HCL 2 MG/2ML IJ SOLN
INTRAMUSCULAR | Status: DC | PRN
Start: 2012-09-13 — End: 2012-09-13
  Administered 2012-09-13: 2 mg via INTRAVENOUS

## 2012-09-13 MED ORDER — PROPOFOL INFUSION 10 MG/ML
INTRAVENOUS | Status: DC | PRN
Start: 2012-09-13 — End: 2012-09-13
  Administered 2012-09-13: 140 ug/kg/min via INTRAVENOUS

## 2012-09-13 MED ORDER — MEPERIDINE HCL 25 MG/ML IJ SOLN
25.0000 mg | INTRAMUSCULAR | Status: DC | PRN
Start: 2012-09-13 — End: 2012-09-13

## 2012-09-13 MED ORDER — HYDROMORPHONE HCL PF 1 MG/ML IJ SOLN
0.5000 mg | INTRAMUSCULAR | Status: DC | PRN
Start: 2012-09-13 — End: 2012-09-13

## 2012-09-13 MED ORDER — ACETAMINOPHEN 325 MG PO TABS
650.0000 mg | ORAL_TABLET | Freq: Once | ORAL | Status: DC | PRN
Start: 2012-09-13 — End: 2012-09-13

## 2012-09-13 MED ORDER — MIDAZOLAM HCL 2 MG/2ML IJ SOLN
INTRAMUSCULAR | Status: AC
Start: 2012-09-13 — End: ?
  Filled 2012-09-13: qty 1

## 2012-09-13 SURGICAL SUPPLY — 39 items
BASIN EME PLS 700ML LF GRAD TRNLU PGMNT (Patient Supply) ×2
BASIN EMESIS 700 ML GRADUATED TRANSLUCENT PIGMENT FREE PLASTIC (Patient Supply) ×1 IMPLANT
CATHETER BALLOON DILATATION CRE PEBAX (Balloon) IMPLANT
CATHETER OD6 FR ODSEC12-13.5-15 MM L180 CM CRE BALLOON DILATATION L8 (Balloon) IMPLANT
CATHETER OD6 FR ODSEC12-13.5-15 MM L180 CM CREâ„¢ BALLOON DILATATION L8 (Balloon) IMPLANT
CLIP QUICKCLIP2 GI FIXATN LONG (Clip) IMPLANT
CONTAINER HISTOLOGY 60 ML 30 ML GRADUATE LEAK RESISTANT O RING PREFILL (Procedure Accessories) IMPLANT
DILATOR ESCP PEBAX CRE 6FR 12-13.5-15MM (Balloon) IMPLANT
FORCEPS BIOPSY L240 CM +2.8 MM HOT OD2.2 (Endoscopic Supplies)
FORCEPS BIOPSY L240 CM +2.8 MM HOT OD2.2 MM RADIAL JAW (Endoscopic Supplies) IMPLANT
FORCEPS BIOPSY L240 CM JUMBO MICROMESH (Instrument)
FORCEPS BIOPSY L240 CM JUMBO MICROMESH TEETH STREAMLINE CATHETER (Instrument) IMPLANT
FORCEPS BX +2.8MM RJ 4 2.2MM 240CM HOT (Endoscopic Supplies)
FORCEPS BX SS JMB RJ 4 2.8MM 240CM STRL (Instrument)
FORCEPS RADIAL JAW 3 W/ NEEDLE (Endoscopic Supplies) ×1 IMPLANT
GOWN ISO YELLOW UNIVERSAL (Gown) ×4 IMPLANT
KIT SMARTPREP APC 30 30ML (Kits) IMPLANT
LINER SCT 1500CC THNWL MDVC FLX ADV LF (Suction) IMPLANT
MARKER ENDOSCOPIC PERMANENT INDICATION (Syringes, Needles) ×1
MARKER ENDOSCOPIC PERMANENT INDICATION DARK SYRINGE SPOT EX 5 ML (Syringes, Needles) ×1 IMPLANT
MARKER ESCP 5ML SPOT EX PERM INDCT DRK (Syringes, Needles) ×1
NET SPEC RTRVL STD RTHNT 2.5MM 230CM LF (Urology Supply)
NET SPECIMEN RETRIEVAL L230 CM STANDARD (Urology Supply)
NET SPECIMEN RETRIEVAL L230 CM STANDARD SHEATH OD2.5 MM L6 CM X W3 CM (Urology Supply) IMPLANT
PAD ELECTROSRG GRND REM W CRD (Procedure Accessories) IMPLANT
SNARE ESCP MED OVL CPTFLX 2.4MM 240CM (Endoscopic Supplies)
SNARE ESCP MIC CPTVTR 13MM 240IN STRL (GE Lab Supplies) ×2
SNARE MD OVAL 240CM 2.4 MM CPTFLX LP FLXBL ENDOSCOPIC POLYPECTOMY 27MM (Endoscopic Supplies) IMPLANT
SNARE MEDIUM OVAL L240 CM OD2.4 MM (Endoscopic Supplies)
SNARE SMALL HEXAGON CAPTIVATOR STIFF ENDOSCOPIC POLYPECTOMY (GE Lab Supplies) ×1 IMPLANT
SOL FORMALIN 10% PREFILL 30ML (Procedure Accessories) ×10
SOLN LUBRICATING JELLY 4.25OZ (Irrigation Solutions) ×2 IMPLANT
SPONGE GAUZE L4 IN X W4 IN 4 PLY HIGH (Sponge) ×1
SPONGE GAUZE L4 IN X W4 IN 4 PLY NONWOVEN LINT FREE CURITY RAYON (Sponge) ×1 IMPLANT
SPONGE GZE RYN PLSTR CRTY 4X4IN LF NS 4 (Sponge) ×1
SYRINGE 50 ML GRADUATE NONPYROGENIC DEHP (Syringes, Needles) ×1
SYRINGE 50 ML GRADUATE NONPYROGENIC DEHP FREE PVC FREE BD MEDICAL (Syringes, Needles) ×1 IMPLANT
SYRINGE MED 50ML LF STRL GRAD N-PYRG (Syringes, Needles) ×1
TRAP SPECIMEN LF (Procedure Accessories) IMPLANT

## 2012-09-13 NOTE — Op Note (Signed)
Procedure Date: 09/13/2012     Patient Type: A     SURGEON: Donella Stade MD  ASSISTANT:       PREOPERATIVE DIAGNOSIS:     POSTOPERATIVE DIAGNOSES:  1.  Seven colon polyps removed with a combination of cold snare, hot snare  and jumbo cold biopsy forceps as outlined above.  2.  Scattered diverticula throughout the colon.  3.  Mild prominent internal hemorrhoids.     TITLE OF POCEDURE:  Colonoscopy with polypectomy.     INDICATION:  Rectal bleeding, right lower quadrant tenderness, and personal history of  colon polyps.     MEDICATIONS:  TIVA.     DESCRIPTION OF PROCEDURE:  The patient was placed in the left lateral decubitus position.  After  adequate sedation was achieved, a rectal exam was performed.  An Olympus  adult colonoscope was introduced into the rectum.  The instrument was then  advanced to the cecum.  This was confirmed by the presence of the  appendiceal orifice and ileocecal valve.  The terminal ileum was intubated.     Upon withdrawal, there was adequate visualization of the cecum, ascending  colon, transverse colon, descending colon, sigmoid colon, and rectum.   Retroflexed exam within the rectum was also performed.     FINDINGS:  Significant for two 6 mm polyps, and the polyps in the transverse colon  removed with jumbo cold biopsy forceps, diminutive cecal polyp removed with  jumbo cold biopsy forceps, 8 mm polyp in the distal ascending colon removed  with cold snare.  In addition, diminutive polyp in the distal ascending  colon removed with jumbo cold biopsy forceps and included in the same  specimen jar as the 8 mm polyp.     The 8 mm polyp in the mid transverse colon removed with hot snare using the  ERBE generator in the coagulation position at 25 watt setting.  A  diminutive polyp at 50 cm from the anal verge removed with jumbo cold  biopsy forceps.  All polyps described were submitted for pathologic  evaluation.     The patient had scattered diverticula throughout the colon.  Also,  mildly  prominent internal hemorrhoids.     IMPRESSION AND PLAN:  1.  Colon polyps -- If adenomatous, I would recommend a repeat colonoscopy  within the next 2 to 5 years depending which of the polyps are adenomatous.   If all are benign or hyperplastic, I would recommend a repeat colonoscopy  in 5 years.  This is taking into consideration his personal history of  colon polyps, also the number of polyps removed today.  2.  Rectal bleeding -- Likely due to internal hemorrhoids detected.   Recommend for the patient to maintain a high-fiber diet.  He is to use  hemorrhoidal ointment or suppositories as needed.  if it does recur in the  future, recommend colorectal surgical assessment.  3.  Right lower quadrant abdominal tenderness -- no pathology identified  that would be a a contributing factor or cause.  Recommend clinical  observation for now.  If this continues, would recommend imaging with CT  scan of the abdomen and pelvis with contrast.           D:  09/13/2012 10:49 AM by Dr. Catheryn Bacon. Jens Som, MD [phone])  T:  09/13/2012 14:04 PM by [email or web address]      Everlean Cherry: [id number]) (Doc ID: [id number])

## 2012-09-13 NOTE — Consults (Signed)
ADMISSION HISTORY AND PHYSICAL EXAM    Date Time: 09/13/2012 9:41 AM  Patient Name: Travis Hogan, Travis Hogan.  Attending Physician: Doyne Keel, MD      History of Present Illness:   Travis Clausing. is a 71 y.o. male who presents with:  Pre-Op Diagnosis Codes:     * Hemorrhage of rectum and anus [569.3]     * Personal history of colonic polyps [V12.72]    Past Medical History:     Past Medical History   Diagnosis Date   . Hypertensive disorder    . Coronary artery disease 2011     CARDIAC STENT X 2   . Sleep apnea    . Asthma without status asthmaticus      FOLLOWED BY DR DSEVAN-CONTROLLED ASTHMA   . Diabetes mellitus type II 2007     BS 110-115 TYPE 2   . Kidney stone 2010     PASSED ON OWN X 2   . Skin cancer 1980'S     BASAL CELL FOREHEAD,  SHOULDER REMOVED   . Blood transfusion without reported diagnosis 1997     OPEN HEART SX   . Arthritis      RT SHOULDER, KNEES, LT TOTAL SHOULDER, BACK   . Hyperlipidemia        Past Surgical History:     Past Surgical History   Procedure Date   . Joint replacement 2010     LT SHOULDER   . Cholecystectomy 1997   . Stem cell knee 2010     STEM CELL INJ INTO LT KNEE TAKEN FROM PT'S BODY FAT   . Coronary angioplasty with stent placement DEC 2011     X2   . Cardiac surgery 1982, 1997     QUAD THEN A TRIPLE BYPASS   . Colonoscopy 2007     HX POLYPS       Family History:   History reviewed. No pertinent family history.    Social History:     History     Social History   . Marital Status: Married     Spouse Name: N/A     Number of Children: N/A   . Years of Education: N/A     Social History Main Topics   . Smoking status: Never Smoker    . Smokeless tobacco: Not on file   . Alcohol Use: No   . Drug Use: No   . Sexually Active:      Other Topics Concern   . Not on file     Social History Narrative   . No narrative on file       Allergies:   No Known Allergies    Medications:     Prescriptions prior to admission   Medication Status Sig   . fish oil-omega-3 fatty acids 1000 MG  capsule Active Take 1 g by mouth 2 (two) times daily.   Marland Kitchen losartan-hydrochlorothiazide (HYZAAR) 100-12.5 MG per tablet Active Take 1 tablet by mouth every morning.   . mometasone (ASMANEX) 220 MCG/INH inhaler Active Inhale 1 puff into the lungs every morning.   Marland Kitchen aspirin 81 MG tablet Active Take 81 mg by mouth daily.   . Cholecalciferol (VITAMIN D3) 2000 UNIT capsule Active Take 2,000 Units by mouth daily.   . fenofibrate micronized (LOFIBRA) 134 MG capsule Active Take 134 mg by mouth every morning before breakfast.   . folic acid (FOLVITE) 1 MG tablet Active Take 2 mg by mouth daily.   Marland Kitchen  omeprazole (PRILOSEC) 20 MG capsule Active Take 20 mg by mouth nightly.   . pioglitazone (ACTOS) 15 MG tablet Active Take 15 mg by mouth 2 (two) times daily.   . Pyridoxine HCl (VITAMIN B-6) 100 MG tablet Active Take 100 mg by mouth daily.   . rosuvastatin (CRESTOR) 20 MG tablet Active Take 20 mg by mouth daily.   . vitamin B-12 (CYANOCOBALAMIN) 250 MCG tablet Active Take 250 mcg by mouth daily.       Review of Systems:   A comprehensive review of systems was: Negative except RLQ abdominal pain.    Physical Exam:     Filed Vitals:    09/13/12 0841   BP: 152/72   Pulse: 58   Temp: 97.7 F (36.5 C)   Resp: 16   SpO2: 98%       Intake and Output Summary (Last 24 hours) at Date Time  No intake or output data in the 24 hours ending 09/13/12 0941    Mental status - alert, oriented to person, place, and time  Chest - clear to auscultation, no wheezes, rales or rhonchi, symmetric air entry  Heart - normal rate, regular rhythm, normal S1, S2, no murmurs, rubs, clicks or gallops  Abdomen - soft, nontender, nondistended, no masses or organomegaly  Extremities - peripheral pulses normal, no pedal edema, no clubbing or cyanosis    Labs:     Results     Procedure Component Value Units Date/Time    POCT Glucose [[id number]]  (Abnormal) Collected:09/13/12 0841    Specimen Information:Blood / Blood Updated:09/13/12 0841     POCT Glucose WB 110 (A)  mg/dL           Rads:     Radiology Results (24 Hour)     ** No Results found for the last 24 hours. **          Assessment:   Rectal bleeding and RLQ abdominal pain.    Plan:   Colonoscopy.  Details and risks explained to the patient.    Signed by: Doyne Keel

## 2012-09-13 NOTE — Anesthesia Postprocedure Evaluation (Signed)
Please refer to Transfer of Care Note for documentation of immediate postanesthetic state, including vital signs, pain control, mental status, assessment of nausea/vomiting, and assessment of hydration status.    The patient's respirations, and cardiovascular status have been evaluated and deemed stable post op.     There were no obvious anesthetic related complications.  The patient is on track for successful transfer out of recovery.     [employer]a [employer] Quincy Gervis Gaba II, MD

## 2012-09-13 NOTE — Discharge Instructions (Signed)
You just had one of the following procedures performed by your physician.  The following findings apply to you if a box below is checked.    Post-Upper Endoscopy (EGD) Post-Colonoscopy   []  Normal    []  Barrett's Esophagus  []  Esophageal Irritation/Inflammation  []  Irregularity of Lining at the Junction of the     Esophagus to the Stomach  []  Narrowing/ring at or near Junction of the Esophagus to the Stomach    []  Hiatal Hernia   []  Stomach Ulcer  []  Stomach Irritation/Inflammation  []  Stomach Polyps  []  Stomach Vascular Malformations    []  Small Intestinal Irritation/Inflammation  []  Small Intestinal Ulcer  []  Small Intestinal Polyp(s)    []  Biopsies      []  Esophagus      []  Stomach        []  H. Pylori Biopsies      []  Small Intestine []  Normal  [x]  Hemorrhoids  [x]  Polyp(s) Removed:  7  []  Random Colon Biopsies  [x]  Diverticulosis  []  Colitis  []  Vascular Malformation     []  Other:        The following instructions apply to you if a box below is checked.    1.   []  After your exam you may experience a mild sore throat.  This should resolve in 1-2 days.  You may use lozenges or gargle with warm salt water.   2.   [x]  After your exam you may experience some increased gas, bloating, or cramping.  If you experience any bleeding, abdominal pain, persistent nausea or vomiting, chest pain, difficulty breathing or swallowing, contact your physician or go to the emergency room immediately.  There may be some inflammation at the site where the intravenous medication was given. If this occurs, you may apply a warm towel. Call your physician if the condition worsens.   3.   [x]  You should begin with a clear liquid diet after the procedure.  Advance your diet as tolerated.  Most likely you can eat a regular diet tonight, unless otherwise instructed by your physician.     4.   [x]  You should be able to resume normal activity (work, house chores, etc.) by tomorrow, unless otherwise instructed by your physician.  DO NOT make  important decisions, sign any documents or drive for at least 24 hours.   5.   [x]  Your physician is recommending the following additional test/testing.  CBC, iron panel, ferritin.     [x]   An order of this/these test(s) will be mailed to your home.  If you do not receive an order within one week of your procedure date, please contact your physician's office.      []   Please contact your physician's office for information on how to arrange for this/these test(s).      6.   []  You should resume all previous medications unless otherwise instructed.  If on blood thinners such as Plavix, Coumadin/Warfarin or Lovenox, please restart       []   Today           []  ____ day(s) after procedure.     7.   []  Please start new medication(s) or change your medication as follows:      8.   [x]  Please call the office to make a follow up appointment:           Doyne Keel, MD         [phone], [phone]  [place], [place] [address]         [phone]           [email or web address]         [email or web address]       9.   _1  Your results will be conveyed to you in one of the following ways:   _2  A. At your follow up appointment with the Doctor who performed your procedure(s).      _3  B. You will receive your results and recommended follow up within two weeks by letter or by a phone call from the [place] office.  Please call the doctor's office if you have not received your results within 2 weeks.   _4  C. Call the office in ___ week(s) for results

## 2012-09-13 NOTE — Brief Op Note (Signed)
BRIEF OP NOTE  Full Note Dictated    Date Time: 09/13/2012 10:45 AM    Patient Name:   Travis Hogan.    Date of Operation:   09/13/2012    Providers Performing:   Surgeon(s):  Sulma Ruffino, Laurian Brim, MD    Assistant (s):   Circulator  Endo Tech  Butts, Delarease - Endo Tech  Norwood, Ben Bolt T, RN - Circulator    Operative Procedure:   Procedure(s):  COLONOSCOPY    Preoperative Diagnosis:   Pre-Op Diagnosis Codes:     * Hemorrhage of rectum and anus [569.3]     * Personal history of colonic polyps [V12.72]    Anesthesia:   Monitor Anesthesia Care    Estimated Blood Loss:   Minimal    Findings:   7 colon polyps removed with combination of cold and hot snare and jumbo cold biopsy forceps.  Diverticulosis throughout  Mildly prominent internal hemorrhoids.    Postoperative Diagnosis:   As Above.    Complications:   None    Signed by: Doyne Keel, MD                                                                           [facility]

## 2012-09-13 NOTE — Transfer of Care (Signed)
Anesthesia Transfer of Care Note    Patient: Travis Hogan.    Procedures performed: Procedure(s) with comments:  COLONOSCOPY    Anesthesia type: General TIVA    Patient location:Phase II PACU    Last vitals:   Filed Vitals:    09/13/12 10:49   BP: 142/65   Pulse: 57   Temp: 97.5 F    Resp: 16   SpO2: 98% @ Room Air       Post pain: Patient not complaining of pain, continue current therapy     Mental Status:awake    Respiratory Function: tolerating room air    Cardiovascular: stable    Nausea/Vomiting: patient not complaining of nausea or vomiting    Hydration Status: adequate    Post assessment: no apparent anesthetic complications. Handed off stable patient to [facility], PACU, RN. Reported intraop events, medical history and medications given. VSS.

## 2012-09-16 LAB — LAB USE ONLY - HISTORICAL SURGICAL PATHOLOGY

## 2014-02-14 ENCOUNTER — Other Ambulatory Visit: Payer: Self-pay | Admitting: Cardiovascular Disease

## 2014-02-14 DIAGNOSIS — R011 Cardiac murmur, unspecified: Secondary | ICD-10-CM

## 2014-02-20 ENCOUNTER — Ambulatory Visit
Admission: RE | Admit: 2014-02-20 | Discharge: 2014-02-20 | Disposition: A | Discharge status: Home / Self Care | Payer: Medicare Other | Source: Ambulatory Visit | Attending: Cardiovascular Disease | Admitting: Cardiovascular Disease

## 2014-02-20 DIAGNOSIS — R0602 Shortness of breath: Secondary | ICD-10-CM | POA: Insufficient documentation

## 2014-02-20 DIAGNOSIS — R079 Chest pain, unspecified: Secondary | ICD-10-CM | POA: Insufficient documentation

## 2014-02-20 DIAGNOSIS — R011 Cardiac murmur, unspecified: Secondary | ICD-10-CM | POA: Insufficient documentation

## 2014-02-20 DIAGNOSIS — I517 Cardiomegaly: Secondary | ICD-10-CM | POA: Insufficient documentation

## 2014-02-20 DIAGNOSIS — I059 Rheumatic mitral valve disease, unspecified: Secondary | ICD-10-CM | POA: Insufficient documentation

## 2014-03-02 ENCOUNTER — Encounter
Admission: RE | Disposition: A | Discharge status: Home / Self Care | Payer: Self-pay | Source: Ambulatory Visit | Attending: Cardiovascular Disease

## 2014-03-02 ENCOUNTER — Ambulatory Visit: Payer: Medicare Other | Admitting: Cardiovascular Disease

## 2014-03-02 ENCOUNTER — Ambulatory Visit
Admission: RE | Admit: 2014-03-02 | Discharge: 2014-03-02 | Disposition: A | Discharge status: Home / Self Care | Payer: Medicare Other | Source: Ambulatory Visit | Attending: Cardiovascular Disease | Admitting: Cardiovascular Disease

## 2014-03-02 DIAGNOSIS — R9439 Abnormal result of other cardiovascular function study: Secondary | ICD-10-CM | POA: Insufficient documentation

## 2014-03-02 DIAGNOSIS — G4733 Obstructive sleep apnea (adult) (pediatric): Secondary | ICD-10-CM | POA: Insufficient documentation

## 2014-03-02 DIAGNOSIS — E119 Type 2 diabetes mellitus without complications: Secondary | ICD-10-CM | POA: Insufficient documentation

## 2014-03-02 DIAGNOSIS — R011 Cardiac murmur, unspecified: Secondary | ICD-10-CM | POA: Insufficient documentation

## 2014-03-02 DIAGNOSIS — I2510 Atherosclerotic heart disease of native coronary artery without angina pectoris: Secondary | ICD-10-CM | POA: Diagnosis present

## 2014-03-02 DIAGNOSIS — I209 Angina pectoris, unspecified: Secondary | ICD-10-CM | POA: Insufficient documentation

## 2014-03-02 DIAGNOSIS — E669 Obesity, unspecified: Secondary | ICD-10-CM | POA: Insufficient documentation

## 2014-03-02 DIAGNOSIS — Z951 Presence of aortocoronary bypass graft: Secondary | ICD-10-CM | POA: Insufficient documentation

## 2014-03-02 DIAGNOSIS — I119 Hypertensive heart disease without heart failure: Secondary | ICD-10-CM | POA: Insufficient documentation

## 2014-03-02 DIAGNOSIS — Z7982 Long term (current) use of aspirin: Secondary | ICD-10-CM | POA: Insufficient documentation

## 2014-03-02 DIAGNOSIS — E7800 Pure hypercholesterolemia, unspecified: Secondary | ICD-10-CM | POA: Insufficient documentation

## 2014-03-02 LAB — GLUCOSE WHOLE BLOOD - POCT: Whole Blood Glucose POCT: 100 mg/dL (ref 70–100)

## 2014-03-02 SURGERY — LEFT HEART CATH POSS PCI
Anesthesia: Conscious Sedation | Laterality: Left

## 2014-03-02 MED ORDER — HEPARIN WASH BOWL 5 UNITS/ML SOLN (CATH LAB)
Status: AC
Start: 2014-03-02 — End: 2014-03-02
  Filled 2014-03-02: qty 2000

## 2014-03-02 MED ORDER — SODIUM CHLORIDE 0.9 % IV SOLN
INTRAVENOUS | Status: AC
Start: 2014-03-02 — End: 2014-03-02

## 2014-03-02 MED ORDER — MIDAZOLAM HCL 2 MG/2ML IJ SOLN
INTRAMUSCULAR | Status: AC
Start: 2014-03-02 — End: 2014-03-02
  Administered 2014-03-02: 2 mg via INTRAVENOUS
  Filled 2014-03-02: qty 2

## 2014-03-02 MED ORDER — IODIXANOL 320 MG/ML IV SOLN
265.0000 mL | Freq: Once | INTRAVENOUS | Status: AC | PRN
Start: 2014-03-02 — End: 2014-03-02
  Administered 2014-03-02: 265 mL via INTRA_ARTERIAL

## 2014-03-02 MED ORDER — FENTANYL CITRATE 0.05 MG/ML IJ SOLN
INTRAMUSCULAR | Status: AC
Start: 2014-03-02 — End: 2014-03-02
  Administered 2014-03-02: 50 ug via INTRAVENOUS
  Filled 2014-03-02: qty 2

## 2014-03-02 MED ORDER — LIDOCAINE HCL (PF) 1 % IJ SOLN
INTRAMUSCULAR | Status: AC
Start: 2014-03-02 — End: 2014-03-02
  Filled 2014-03-02: qty 30

## 2014-03-02 NOTE — Progress Notes (Signed)
Pt. Ambulated twice w/o difficulty, voided.   Right groin site c/d/i, no hematoma.   Pt. Denies pain and verbalized understanding of all discharge instructions.  aldrete 10.  IV and monitor discontinued, and pt. Escorted to vehicle in wheelchair.

## 2014-03-02 NOTE — Progress Notes (Signed)
Received pt. From [facility]cl staff.  Right groin site c/d/i, no hematoma.  Pt. Denies pain.  Distal pulses present.

## 2014-03-02 NOTE — Procedures (Signed)
BRIEF H&P Includes ASA    Date Time: 03/02/2014 7:02 AM      PROCEDURALIST COMMENTS BELOW:   LHC, grafts, VGRAM      INDICATIONS:   CAD    REVIEW OF SYSTEMS:   YES  ( x )         ALLERGIES:     NO  (x  )   YES  (  )        PHYSICAL EXAM     AIRWAY CLASSIFICATION:    CLASS I   (  )     CLASS II  ( x )    CLASS III  (  )     CLASS IV  (  )    CARDIAC :   (x  )  RRR  (  )  IRREG  (  )  MURMUR      LUNGS:   (x  )  CLEAR  (  )  DIMINISHED    (  ) LEFT   (  )  RIGHT  (  )  ABSENT          (  ) LEFT   (  )  RIGHT  (  )  TUBES            (  ) LEFT   (  )  RIGHT          ABDOMEN:   NT/ND      NEURO:   A/O X 3      OTHER:       LABS:     Lab Results   Component Value Date/Time    WBC 12.40* 11/17/2010 10:30 AM    HCT 40.3 11/17/2010 10:30 AM    PLT 316 11/17/2010 10:30 AM    BUN 26* 11/17/2010 10:30 AM    GLU 141* 11/17/2010 10:30 AM    K 4.0 11/17/2010 10:30 AM           ASA PHYSICAL STATUS   (  )  ASA 1   HEALTHY PATIENT  (x  )  ASA 2   MILD SYSTEMIC ILLNESS  (  )  ASA 3   SYSTEMIC DISEASE, NOT INCAPACITATING  (  )  ASA 4   SEVERE SYSTEMIC DISEASE, DISEASE IS CONSTANT THREAT TO                         LIFE  (  )  ASA 5   MORIBUND CONDITION, NOT EXPECTED TO LIVE >24 HOURS            IRRESPECTIVE OF PROCEDURE  (  )  E           EMERGENCY PROCEDURE       PLANNED SEDATION:   (  ) NO SEDATION  ( x ) MODERATE SEDATION  (  ) DEEP SEDATION WITH ANESTHESIA      CONCLUSION:   PATIENT HAS BEEN REASSESSED IMMEDIATELY PRIOR TO THE PROCEDURE   AND IS AN APPROPRIATE CANDIDATE FOR THE PLANNED SEDATION AND   PROCEDURE.  RISKS, BENEFITS AND ALTERNATIVES TO THE PLANNED   PROCEDURE AND SEDATION HAVE BEEN EXPLAINED TO THE PATIENT   OR GUARDIAN.    (x  )  YES  (  )  EMERGENCY CONSENT         Signed by Bertram Millard Braidyn Peace.

## 2014-03-02 NOTE — Procedures (Signed)
CathPCI Registry, created from v.4.4  Diagnostic Catheterization and Percutaneous Coronary Intervention Registry      Clinical evaluation leading to the procedure:     CAD presentation:      ___ No symptoms, no angina (14 days)   ___ Symptoms unlikely to be ischemic (14 days)   _x__ Stable angina (42 days)   ___ Unstable angina (60 days)   ___ Non-STEMI (7days)   ___ STEMI (7 days)    If STEMI or Non-STEMI:    Symptom onset date / time:       ___ Time estimated     ___ Time unavailable    If STEMI:    Thrombolytics? no    If Yes, Start date / time:     Anginal Classification within 2 weeks:      ___ No symptoms   ___ CCS I   ___ CCS II   ___ CCS III   ___ CCS IV    Diagnostic Catheterization Procedure:     Diagnostic coronary angiography: yes    Left heart catheterization: yes    Cardiac Transplant evaluation: no   If Yes:     ___ Donor for cardiac transplant    ___ Candidate to receive cardiac transplant    ___ Post cardiac transplant follow up      Diagnostic Cath Status:      ___ Elective   ___ Urgent    ___ Emergency   ___ Salvage    Rx Recommendations (after diagnostic cath)     ___ None   ___ medical therapy / counseling   ___ PCI w/out planned CABG   ___ CABG (including plnned hybrid CABG / PCI procedures)   ___ Other cardiac therapy without CABG or PCI    Test performed:     Standard Exercise Stress Test (w/o imaging): no   If yes:     ___ negative    ___ positive    ___ Indeterminant    ___ Unavailable     If positive, risk / extent of ischemia:      ___ Low    ___ Intermediate    ___ High    ___ Unavailable    Stress Echocardiogram: no   If yes:     ___ negative    ___ positive    ___ Indeterminant    ___ Unavailable     If positive, risk / extent of ischemia:      ___ Low    ___ Intermediate    ___ High    ___ Unavailable    Stress Testing with SPECT MPI: yes   If yes:     ___ negative    _x__ positive    ___ Indeterminant    ___ Unavailable     If positive, risk / extent of ischemia:      ___ Low    ___  Intermediate    ___ High    ___ Unavailable    Stress Testing with CMR: no   If yes:     ___ negative    ___ positive    ___ Indeterminant    ___ Unavailable     If positive, risk / extent of ischemia:      ___ Low    ___ Intermediate    ___ High    ___ Unavailable    Cardiac CTA: no   If yes:     ___ No disease    ___ 1VD    ___ 2VD  ___3VD    ___ Indeterminant    ___ Unavailable    Coronary Calcium Score: no   If yes:     Calcium Score:     Coronary Territory:     Best Estimate of Coronary Anatomy:    Dominance: Right    Native Artery % stenosis in >= 2mm vessels      Left Main: 20%   ___ Unavailable   Prox LAD: 30   ___ Unavailable   Mid / Distal LAD, Diag branches: 100   ___ Unavailable    Circ, OMs, LPDA, LPL branches: 30   ___ Unavailable   RCA, RPDA, RPL, AM branches: 100   ___ Unavailable      Versed, fentanyl  1% lido  6FR sh RFA  JL4, JL3.5, 3DRC, RCB    LM 20%  LAD 30% then mid occluded  LCX 30%.  Mid stents patent.  RCA 100%    Several SVG stumps demonstrated.  LIMA to LAD widely patent  SVG to RCA proximal 30% disease (stented previously).  Touches down onto RPL.  RPDA has proximal 80% disease.  Would be very difficult to reach (native RCA occluded, would need to go down SVG to RCA which touches down onto RPL, then backwards up RPL back to crux and then down RPDA).      VGRAM LVEF 55%.  No AS/significant MR.  Aortogram.  No other grafts demonstrated.    IMPRESSIONS:  1.  Native 3V CAD.  2.  Patent LIMA to LAD, patent stents native LCX, patent/stented SVG to RPL.  3.  Disease proximal RPDA - not approachable percutaneously (native vessel occluded and SVG graft touches down on RPL, not PRDA).  4.  Preserved LVEF.  5.  Aortogram as above.    RECOMMENDATIONS:  Medical rx for CAD.  Discharge after bedrest/IVF.   F/u Dr. Levy Sjogren or NP, 1-2 weeks.

## 2014-03-02 NOTE — Discharge Instructions (Signed)
Interventional Cardiovascular Admission and Recovery                Catheterization Discharge Instructions                                         Groin Access    Closure Device:  angioseal    Access Site: right femoral artery  Activity:  1. No driving for 24 hours following the procedure due to medications you may have received. Avoid alcohol for the next 24 hours.  2. Rest today and tomorrow, gradually increasing to your usual activities.  3. Limit stair usage for the next 24 hours. If you must use the stairs, take them one at a time, leading with your unaffected leg holding pressure on the bandaged site.  4. Do not lift anything over 10 pounds in weight for five (5) days. That includes pushing, pulling, dragging or moving anything.  5. No strenuous activity for five (5) days. Do not attempt anything that may cause fatigue, shortness of breath, perspiration or chest pain.   6. Support the bandaged site when coughing or sneezing. Do not strain when having a bowel movement.   7. Drink 6-8 glasses of water daily for at least 48 hours to help flush your body of the dye used during the procedure.    Access Site Care:  1. No tub baths, hot tubs, pools or sitting in water for one week.  2. You may shower 24 hours after your procedure. Leave the bandage in place and let the water passively flow over the site.  3. REMOVE the dressing 48 hours after your procedure, either before or during your shower. Again, let the water passively flow over the site, wash gently with your hand, then pat the area dry.   4. Do not rub, pick or scratch the area.   5. Do not apply creams, powders, lotions, or ointments to the site.   6. Apply a regular sized Band-Aid to the puncture site and change it daily for five (5) days. You may shower daily.  7. Observe for signs of infection:  redness, warmth, swelling, drainage, or temperature greater than 100 degrees F.  If you suspect infection call the doctor who performed the  procedure.    Normal Observation:  1. You may feel tenderness. May take ACETAMINOPHEN (TYLENOL) if needed.  2. You may experience some mild bruising.   3.  IF a closure device was used, you may feel a small lump, about the size of an olive pit, which should disappear within 90 days    Call 911 if:  1. You are experiencing unrelieved chest pain.  2. You notice bleeding either through the dressing or underneath the skin. If the blood is trapped under the skin, the area will hurt, become swollen and hard. If either happens, lay down flat and hold pressure on the site. This is an arterial bleed, and may become an emergency if unattended.   3. Your leg becomes cold, numb, painful, grayish in color, or change from usual color/sensation.

## 2014-03-02 NOTE — Progress Notes (Signed)
Pt admitted to [facility] 9 for LHC.  NPO verified  Pre- Cath Teaching and Learning Objectives   Learner: Agapito Games,   Preference for learning: Verbal  Teaching Method: Verbal Instruction   Outcome of Learning: Fully Achieved    Described/Demonstrated the following:     + Responsibilities of patient's care  + Cardiac Cath/PTCA/Stent/Brachytherapy   + Purpose of procedure  + Need to be NPO pre-procedure  + Need for maintaining bedrest & straight leg post-procedure & sheath removal.  + Necessary fluid intake after procedure  + Symptoms of bleeding & states plan to notify nurse.

## 2014-09-22 ENCOUNTER — Encounter: Payer: Self-pay | Admitting: Infectious Disease

## 2014-09-22 ENCOUNTER — Ambulatory Visit: Payer: Medicare Other | Attending: Family | Admitting: Infectious Disease

## 2014-09-22 DIAGNOSIS — L97829 Non-pressure chronic ulcer of other part of left lower leg with unspecified severity: Secondary | ICD-10-CM | POA: Insufficient documentation

## 2014-09-22 DIAGNOSIS — G4733 Obstructive sleep apnea (adult) (pediatric): Secondary | ICD-10-CM | POA: Insufficient documentation

## 2014-09-22 DIAGNOSIS — I10 Essential (primary) hypertension: Secondary | ICD-10-CM | POA: Insufficient documentation

## 2014-09-22 DIAGNOSIS — X58XXXD Exposure to other specified factors, subsequent encounter: Secondary | ICD-10-CM | POA: Insufficient documentation

## 2014-09-22 DIAGNOSIS — E782 Mixed hyperlipidemia: Secondary | ICD-10-CM | POA: Insufficient documentation

## 2014-09-22 DIAGNOSIS — E119 Type 2 diabetes mellitus without complications: Secondary | ICD-10-CM | POA: Insufficient documentation

## 2014-09-22 DIAGNOSIS — J4520 Mild intermittent asthma, uncomplicated: Secondary | ICD-10-CM | POA: Insufficient documentation

## 2014-09-22 DIAGNOSIS — C44319 Basal cell carcinoma of skin of other parts of face: Secondary | ICD-10-CM | POA: Insufficient documentation

## 2014-09-22 DIAGNOSIS — L97821 Non-pressure chronic ulcer of other part of left lower leg limited to breakdown of skin: Secondary | ICD-10-CM

## 2014-09-22 DIAGNOSIS — I252 Old myocardial infarction: Secondary | ICD-10-CM | POA: Insufficient documentation

## 2014-09-22 DIAGNOSIS — Z7982 Long term (current) use of aspirin: Secondary | ICD-10-CM | POA: Insufficient documentation

## 2014-09-22 DIAGNOSIS — I2510 Atherosclerotic heart disease of native coronary artery without angina pectoris: Secondary | ICD-10-CM | POA: Insufficient documentation

## 2014-09-22 NOTE — Progress Notes (Signed)
See Intellicure notes

## 2014-09-27 ENCOUNTER — Ambulatory Visit: Payer: Medicare Other | Attending: Family

## 2014-09-27 DIAGNOSIS — E119 Type 2 diabetes mellitus without complications: Secondary | ICD-10-CM | POA: Insufficient documentation

## 2014-09-27 DIAGNOSIS — I10 Essential (primary) hypertension: Secondary | ICD-10-CM | POA: Insufficient documentation

## 2014-09-27 DIAGNOSIS — L97822 Non-pressure chronic ulcer of other part of left lower leg with fat layer exposed: Secondary | ICD-10-CM

## 2014-09-27 NOTE — Progress Notes (Signed)
See Intellicure notes

## 2014-10-04 ENCOUNTER — Ambulatory Visit: Payer: Medicare Other

## 2014-10-05 ENCOUNTER — Ambulatory Visit: Payer: Medicare Other | Attending: Family

## 2014-10-05 DIAGNOSIS — E782 Mixed hyperlipidemia: Secondary | ICD-10-CM | POA: Insufficient documentation

## 2014-10-05 DIAGNOSIS — J4520 Mild intermittent asthma, uncomplicated: Secondary | ICD-10-CM | POA: Insufficient documentation

## 2014-10-05 DIAGNOSIS — I25810 Atherosclerosis of coronary artery bypass graft(s) without angina pectoris: Secondary | ICD-10-CM | POA: Insufficient documentation

## 2014-10-05 DIAGNOSIS — Z09 Encounter for follow-up examination after completed treatment for conditions other than malignant neoplasm: Secondary | ICD-10-CM | POA: Insufficient documentation

## 2014-10-05 DIAGNOSIS — I10 Essential (primary) hypertension: Secondary | ICD-10-CM | POA: Insufficient documentation

## 2014-10-05 DIAGNOSIS — E663 Overweight: Secondary | ICD-10-CM | POA: Insufficient documentation

## 2014-10-05 DIAGNOSIS — Z7982 Long term (current) use of aspirin: Secondary | ICD-10-CM | POA: Insufficient documentation

## 2014-10-05 DIAGNOSIS — C44319 Basal cell carcinoma of skin of other parts of face: Secondary | ICD-10-CM | POA: Insufficient documentation

## 2014-10-05 DIAGNOSIS — E119 Type 2 diabetes mellitus without complications: Secondary | ICD-10-CM | POA: Insufficient documentation

## 2014-10-05 DIAGNOSIS — L97822 Non-pressure chronic ulcer of other part of left lower leg with fat layer exposed: Secondary | ICD-10-CM

## 2014-10-05 DIAGNOSIS — G4733 Obstructive sleep apnea (adult) (pediatric): Secondary | ICD-10-CM | POA: Insufficient documentation

## 2014-10-05 NOTE — Progress Notes (Signed)
See Intellicure note

## 2014-12-25 ENCOUNTER — Ambulatory Visit (INDEPENDENT_AMBULATORY_CARE_PROVIDER_SITE_OTHER): Payer: Self-pay | Admitting: Cardiovascular Disease

## 2015-04-20 ENCOUNTER — Ambulatory Visit (INDEPENDENT_AMBULATORY_CARE_PROVIDER_SITE_OTHER): Payer: Self-pay | Admitting: Cardiovascular Disease

## 2015-10-22 ENCOUNTER — Ambulatory Visit (INDEPENDENT_AMBULATORY_CARE_PROVIDER_SITE_OTHER): Payer: Self-pay | Admitting: Interventional Cardiology

## 2016-04-21 ENCOUNTER — Ambulatory Visit (INDEPENDENT_AMBULATORY_CARE_PROVIDER_SITE_OTHER): Payer: Self-pay | Admitting: Cardiovascular Disease

## 2017-05-08 ENCOUNTER — Ambulatory Visit (INDEPENDENT_AMBULATORY_CARE_PROVIDER_SITE_OTHER): Payer: Self-pay | Admitting: Cardiovascular Disease

## 2017-10-15 ENCOUNTER — Ambulatory Visit (INDEPENDENT_AMBULATORY_CARE_PROVIDER_SITE_OTHER): Payer: Self-pay | Admitting: Cardiovascular Disease

## 2017-11-26 ENCOUNTER — Ambulatory Visit (INDEPENDENT_AMBULATORY_CARE_PROVIDER_SITE_OTHER): Payer: Self-pay | Admitting: Adult Health

## 2018-04-06 ENCOUNTER — Ambulatory Visit (INDEPENDENT_AMBULATORY_CARE_PROVIDER_SITE_OTHER): Payer: Self-pay | Admitting: Cardiovascular Disease

## 2018-04-08 ENCOUNTER — Encounter (INDEPENDENT_AMBULATORY_CARE_PROVIDER_SITE_OTHER): Payer: Self-pay | Admitting: Internal Medicine

## 2018-10-12 ENCOUNTER — Ambulatory Visit (INDEPENDENT_AMBULATORY_CARE_PROVIDER_SITE_OTHER): Payer: Self-pay | Admitting: Cardiovascular Disease

## 2019-11-07 ENCOUNTER — Ambulatory Visit (INDEPENDENT_AMBULATORY_CARE_PROVIDER_SITE_OTHER): Payer: Self-pay | Admitting: Cardiovascular Disease

## 2020-04-07 ENCOUNTER — Other Ambulatory Visit (INDEPENDENT_AMBULATORY_CARE_PROVIDER_SITE_OTHER): Payer: Self-pay | Admitting: Cardiovascular Disease

## 2020-04-07 DIAGNOSIS — I25119 Atherosclerotic heart disease of native coronary artery with unspecified angina pectoris: Secondary | ICD-10-CM

## 2020-04-24 ENCOUNTER — Ambulatory Visit (INDEPENDENT_AMBULATORY_CARE_PROVIDER_SITE_OTHER): Payer: Medicare Other

## 2020-04-24 DIAGNOSIS — I25119 Atherosclerotic heart disease of native coronary artery with unspecified angina pectoris: Secondary | ICD-10-CM

## 2020-04-30 ENCOUNTER — Telehealth (INDEPENDENT_AMBULATORY_CARE_PROVIDER_SITE_OTHER): Payer: Self-pay

## 2020-04-30 NOTE — Telephone Encounter (Signed)
S/W Fleet Contras (HIPPA-approved) to relay stable echo results per message from Dr. Ethlyn Daniels.  Patient will look forward to F/U with Dr. Levy Sjogren as already scheduled 05/03/2020

## 2020-05-02 ENCOUNTER — Encounter (INDEPENDENT_AMBULATORY_CARE_PROVIDER_SITE_OTHER): Payer: Self-pay

## 2020-05-03 ENCOUNTER — Encounter (INDEPENDENT_AMBULATORY_CARE_PROVIDER_SITE_OTHER): Payer: Self-pay | Admitting: Cardiovascular Disease

## 2020-05-03 ENCOUNTER — Ambulatory Visit (INDEPENDENT_AMBULATORY_CARE_PROVIDER_SITE_OTHER): Payer: Medicare Other | Admitting: Cardiovascular Disease

## 2020-05-03 VITALS — BP 158/86 | HR 68

## 2020-05-03 DIAGNOSIS — I25119 Atherosclerotic heart disease of native coronary artery with unspecified angina pectoris: Secondary | ICD-10-CM

## 2020-05-03 MED ORDER — EZETIMIBE 10 MG PO TABS
10.0000 mg | ORAL_TABLET | Freq: Every day | ORAL | 3 refills | Status: DC
Start: 2020-05-03 — End: 2020-05-04

## 2020-05-03 MED ORDER — SPIRONOLACTONE 25 MG PO TABS
25.0000 mg | ORAL_TABLET | Freq: Every day | ORAL | 3 refills | Status: DC
Start: 2020-05-03 — End: 2020-05-04

## 2020-05-03 NOTE — Progress Notes (Signed)
[facility] OFF[facility] PROGRESS NOTE    HRT [facility] OFFICE      [facility] OFFICE -CARDIOLOGY  [address]  [place] [address]  Dept: [phone]  Dept Fax: [phone]       Patient Name: Travis Hogan    Date of Visit:  May 03, 2020  Date of Birth: 02-10-1941  AGE: 79 y.o.  Medical Record #: [id number]  Requesting Physician: Conception Oms, MD      CHIEF COMPLAINT: Shortness of Breath      HISTORY OF PRESENT ILLNESS:    He is a pleasant 79 y.o. male who presents today for follow-up of CAD and hypertension.    He actually brought me as a gift a lovely enlargement of a photograph he had taken of the last Danella Deis really would see mentions in [place], [place].  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  He also very kindly sent me his annual Christmas card, which she usually selects one of his nature photographs is taken during the year.    Since I last saw him his primary care physician and him working on his blood pressure which has been running above target.  His amlodipine was increased from 5 mg to 10 mg, and his losartan was escalated to Hyzaar 100/25 mg daily.  Despite this, he is actually gotten quite a bit swollen his feet, which may be due to the amlodipine being increased.  His other blood pressure readings for the last few weeks show a slight decrease in the systolics, the diastolics remain a little bit above target and are occasional a bit higher than before which she found puzzling.  He reports no chest pain and his shortness of breath only of any frequency with stairs even though he does not get them when he walks on his  elliptical or uses a recumbent bicycle.    His LDL cholesterol is gone up a bit from 75 to 84 mg/dL but he feels like dietary measures are under control, and is exercising regularly.      PAST MEDICAL HISTORY: He has a past medical history of Arthritis, Asthma without status asthmaticus, Blood transfusion without reported diagnosis (1997), Coronary artery disease (2011), Diabetes mellitus type II (2007), Hyperlipidemia, Hypertensive disorder, Kidney stone (2010), Skin cancer (1980'S), and Sleep apnea. He has a past surgical history that includes Joint replacement (2010); Cholecystectomy (1997); STEM CELL KNEE (2010); Coronary angioplasty with stent (DEC 2011); Cardiac surgery (1982, 1997); and Colonoscopy (2007).    ALLERGIES:   Allergies   Allergen Reactions    Atorvastatin Other (See Comments)     Muscle Pain       MEDICATIONS:   Current Outpatient Medications   Medication Sig    amLODIPine (NORVASC) 10 MG tablet Take 10 mg by mouth daily       aspirin 81 MG tablet Take 81 mg by mouth daily.    cetirizine (ZYRTEC) 10 MG tablet Take 10 mg by mouth daily.    fenofibrate micronized (LOFIBRA) 134 MG capsule Take 134 mg by mouth every morning before breakfast.    fish oil-omega-3 fatty acids 1000 MG capsule Take  1 g by mouth 2 (two) times daily.    losartan-hydrochlorothiazide (HYZAAR) 100-12.5 MG per tablet Take 1 tablet by mouth every morning.    Pyridoxine HCl (VITAMIN B-6) 100 MG tablet Take 100 mg by mouth daily.    rosuvastatin (CRESTOR) 40 MG tablet Take 40 mg by mouth daily       Vitamin D3 (CHOLECALCIFEROL) 125 MCG (5000 UT) Tab tablet Take 5,000 Units by mouth once a week       Calcium-Magnesium-Zinc (CALCIUM-MAGNESUIUM-ZINC PO) Take 2 tablets by mouth 2 (two) times daily.    folic acid (FOLVITE) 1 MG tablet Take 2 mg by mouth daily.    Lactobacillus (ACIDOPHILUS PROBIOTIC PO) Take by mouth.    Multiple Vitamins-Minerals (MULTIVITAMIN WITH MINERALS) tablet Take 1 tablet by mouth daily.     Potassium 99 MG Tab Take 1 tablet by mouth daily.    Probiotic Product (ACIDOPHILUS/BIFIDUS PO) Take 1 capsule by mouth daily    RANEXA 500 MG 12 hr tablet     vitamin B-12 (CYANOCOBALAMIN) 250 MCG tablet Take 250 mcg by mouth daily.        FAMILY HISTORY: family history is not on file.    SOCIAL HISTORY: He reports that he has never smoked. He has never used smokeless tobacco. He reports that he does not drink alcohol and does not use drugs.    PHYSICAL EXAMINATION    Visit Vitals  BP 158/86 (BP Site: Right arm, Patient Position: Sitting)   Pulse 68       General Appearance:  A well-appearing male in no acute distress.    Skin: Warm and dry to touch, no apparent skin lesions, or masses noted.  Head: Normocephalic, normal hair pattern, no masses or tenderness   Eyes: EOMS Intact, PERRL, conjunctivae and lids unremarkable.  ENT: Ears, Nose and throat reveal no gross abnormalities.  No pallor or cyanosis.  Dentition good.   Neck: JVP normal, no carotid bruit, thyroid not enlarged   Chest: Clear to auscultation bilaterally with good air movement and respiratory effort and no wheezes, rales, or rhonchi   Cardiovascular: Regular rhythm, S1 normal, S2 normal, No S3 or S4, Apical impulse not displaced. No murmurs. No gallops or rubs detected , healed sternotomy  Abdomen: Soft, nontender, nondistended, with normoactive bowel sounds. No organomegaly.  No pulsatile masses, or bruits.   Extremities: 2+ dorsal pedal edema bilaterally.  Neuro: Alert and oriented x3. No gross motor or sensory deficits noted, affect appropriate.          LABS:   No results found for: CBC  Lab Results   Component Value Date    AST 21 11/17/2010    ALT 26 11/17/2010         IMPRESSION:   [name] is a 79 y.o. male with the following problems:     Coronary artery disease.   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   Subsequent NSTEMI in 2012, status post stenting to his native circumflex with residual total occlusions of two  vein grafts that were too small to intervene on.   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  Has been medically managed since then.   Intolerance to Imdur (headache) and Ranexa (dizziness).   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   Hypertension, still mildly above target despite increasing amlodipine and escalation up  to Hyzaar 100/25 daily.   Echocardiogram May 2021: EF 55 to 60%, grade 1 diastolic dysfunction.   Pedal edema, worse recently, suspect this may have been influenced little bit by the increase in his amlodipine dose.   Hypertensive heart disease.   Obstructive sleep apnea, on CPAP.   Hyperlipidemia, on recently increased dose of Crestor.  Last LDL July was around target at 75 but he is now running a little bit about that consistently, LDL now back up to 84.Marland Kitchen  HDL remains low chronically.   Diabetes mellitus.   Obesity, somewhat loosely back on the Ideal Protein diet, has lost about 7 pounds since last visit.  He is now supplementing with IP foods but he is not ketotic.   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   History of reactive airway disease.   Participation in [place] trial, now concluded.   S/p bilateral cataract surgery.     RECOMMENDATIONS:     His blood pressure is still about 5-10 points above target and he is getting some edema which may have been exacerbated by the increase amlodipine.   Nonetheless, rather than revamp the entire medication list, I am adding Aldactone 25 mg daily to see if that alleviates some of the swelling but also may provide some significant blood pressure relief.   He will start the Aldactone immediately but will add Zetia 2 weeks later, as treatment for his elevated LDL cholesterol in the setting of prior CAD and non-ST ovation MI.   We will check a Chem-7 in about a month, and repeat lipids in 4 to 6 months.    Telemedicine follow-up either with myself with APP in 1 month to review blood pressure treatment.   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   As always it was my pleasure to see [name] in the office today.                                                   No orders of the defined types were placed in this encounter.      No orders of the defined types were placed in this encounter.      SIGNED:    Lelan Pons, MD          This note was generated by the Dragon speech recognition and may contain errors or omissions not intended by the user. Grammatical errors, random word insertions, deletions, pronoun errors, and incomplete sentences are occasional consequences of this technology due to software limitations. Not all errors are caught or corrected. If there are questions or concerns about the content of this note or information contained within the body of this dictation, they should be addressed directly with the author for clarification.

## 2020-05-04 ENCOUNTER — Other Ambulatory Visit (INDEPENDENT_AMBULATORY_CARE_PROVIDER_SITE_OTHER): Payer: Self-pay

## 2020-05-04 DIAGNOSIS — I25119 Atherosclerotic heart disease of native coronary artery with unspecified angina pectoris: Secondary | ICD-10-CM

## 2020-05-04 MED ORDER — SPIRONOLACTONE 25 MG PO TABS
25.0000 mg | ORAL_TABLET | Freq: Every day | ORAL | 3 refills | Status: DC
Start: 2020-05-04 — End: 2021-05-04

## 2020-05-04 MED ORDER — EZETIMIBE 10 MG PO TABS
10.0000 mg | ORAL_TABLET | Freq: Every day | ORAL | 3 refills | Status: DC
Start: 2020-05-04 — End: 2021-03-26

## 2020-05-04 NOTE — Telephone Encounter (Signed)
Pt phoned to say that his preferred pharmacy is [facility]. He asked the the prescriptions that were sent yesterday to [facility] be sent to [facility]. Request completed.

## 2020-06-03 ENCOUNTER — Encounter (INDEPENDENT_AMBULATORY_CARE_PROVIDER_SITE_OTHER): Payer: Self-pay | Admitting: Cardiovascular Disease

## 2020-06-03 DIAGNOSIS — I25119 Atherosclerotic heart disease of native coronary artery with unspecified angina pectoris: Secondary | ICD-10-CM

## 2020-06-19 ENCOUNTER — Encounter (INDEPENDENT_AMBULATORY_CARE_PROVIDER_SITE_OTHER): Payer: Self-pay | Admitting: Physician Assistant

## 2020-06-19 ENCOUNTER — Ambulatory Visit (INDEPENDENT_AMBULATORY_CARE_PROVIDER_SITE_OTHER): Payer: Medicare Other | Admitting: Physician Assistant

## 2020-06-19 VITALS — BP 118/70 | HR 80 | Ht 66.0 in | Wt 241.0 lb

## 2020-06-19 DIAGNOSIS — I10 Essential (primary) hypertension: Secondary | ICD-10-CM

## 2020-06-19 DIAGNOSIS — E785 Hyperlipidemia, unspecified: Secondary | ICD-10-CM

## 2020-06-19 DIAGNOSIS — E669 Obesity, unspecified: Secondary | ICD-10-CM

## 2020-06-19 DIAGNOSIS — I2510 Atherosclerotic heart disease of native coronary artery without angina pectoris: Secondary | ICD-10-CM

## 2020-06-19 NOTE — Progress Notes (Signed)
[place] [facility] OFFICE PROGRESS NOTE    HRT [facility] OFFICE      [place] [facility] OFFICE -CARDIOLOGY  [address]  [place] [address]  Dept: [phone]  Dept Fax: [phone]       Patient Name: Travis Hogan    Date of Visit:  June 19, 2020  Date of Birth: [birth year]  AGE: 79 y.o.  Medical Record #: [id number]  Requesting Physician: Conception Oms, MD      CHIEF COMPLAINT: Coronary Artery Disease (routine f/u), Hyperlipidemia, and Hypertension      HISTORY OF PRESENT ILLNESS:    He is a pleasant 79 y.o. male who presents today for follow-up hypertension. At last appointment primary cardiologist, his amlodipine was discontinued given lower extremity edema and he was started on spironolactone. He has not yet had blood work drawn as advised. Home blood pressures have been well-appearing typically 1 10-1 20 systolic. Denies dyspnea, chest pain, palpitations, syncope, or other complaints. Does 1 hour of recumbent bicycle and elliptical exercise on Mondays Wednesdays and Fridays.      PAST MEDICAL HISTORY: He has a past medical history of Arthritis, Asthma without status asthmaticus, Blood transfusion without reported diagnosis (1997), Coronary artery disease (2011), Diabetes mellitus type II (2007), Hyperlipidemia, Hypertensive disorder, Kidney stone (2010), Skin cancer (1980'S), and Sleep apnea. He has a past surgical history that includes Joint replacement (2010); Cholecystectomy (1997); STEM CELL KNEE (2010); Coronary angioplasty with stent (DEC 2011); Cardiac surgery (1982, 1997); and Colonoscopy (2007).    ALLERGIES:   Allergies   Allergen Reactions    Atorvastatin Other (See Comments)     Muscle Pain       MEDICATIONS:   Current Outpatient Medications   Medication Sig    aspirin 81 MG tablet Take 81 mg by mouth daily.    cetirizine (ZYRTEC) 10 MG tablet Take 10 mg by mouth daily.    ezetimibe (ZETIA) 10 MG tablet Take 1 tablet (10 mg total) by mouth daily     fenofibrate micronized (LOFIBRA) 134 MG capsule Take 134 mg by mouth every morning before breakfast.    losartan-hydrochlorothiazide (HYZAAR) 100-12.5 MG per tablet Take 1 tablet by mouth every morning.    Pyridoxine HCl (VITAMIN B-6) 100 MG tablet Take 100 mg by mouth daily.    rosuvastatin (CRESTOR) 40 MG tablet Take 40 mg by mouth daily       spironolactone (ALDACTONE) 25 MG tablet Take 1 tablet (25 mg total) by mouth daily    Vitamin D3 (CHOLECALCIFEROL) 125 MCG (5000 UT) Tab tablet Take 5,000 Units by mouth once a week           FAMILY HISTORY: family history is not on file.    SOCIAL HISTORY: He reports that he has never smoked. He has never used smokeless tobacco. He reports that he does not drink alcohol and does not use drugs.    PHYSICAL EXAMINATION    Visit Vitals  BP 118/70 (BP Site: Right arm, Patient Position: Sitting, Cuff Size: Large)   Pulse 80   Ht 1.676 m (5\' 6" )   Wt 109.3 kg (241 lb)   BMI 38.90 kg/m       General Appearance:  A well-appearing male in no acute distress.    Skin: Warm and dry to touch  Head: Normocephalic, normal hair pattern  Eyes: EOMS Intact  ENT: Masked  Neck:  no carotid bruit  Chest: Clear to auscultation bilaterally  Cardiovascular: Regular rhythm, no  overt murmur  Abdomen: No guarding  Extremities: Warm without edema.    Neuro: Alert and oriented x3.       LABS:   Lab Results   Component Value Date    WBC 12.40 (H) 11/17/2010    HGB 14.0 11/17/2010    HCT 40.3 11/17/2010    PLT 316 11/17/2010     Lab Results   Component Value Date    GLU 141 (H) 11/17/2010    BUN 26 (H) 11/17/2010    CREAT 1.68 (H) 11/17/2010    NA 135 11/17/2010    K 4.0 11/17/2010    CL 99 (L) 11/17/2010    CO2 27 11/17/2010    AST 21 11/17/2010    ALT 26 11/17/2010     No results found for: MG, TSH, HGBA1C, BNP  No results found for: CHOL, TRIG, HDL, LDL        IMPRESSION:   Mr. Ferencz is a 79 y.o. male with the following problems:     Coronary artery disease.  ? Status post four-vessel CABG  in 1982 with a redo in 1997.  ? Subsequent NSTEMI in 2012, status post stenting to his native circumflex with residual total occlusions of two vein grafts that were too small to intervene on.  ? 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  Has been medically managed since then.   Intolerance to Imdur (headache) and Ranexa (dizziness).   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   Hypertension,  controlled   Echocardiogram May 2021: EF 55 to 60%, grade 1 diastolic dysfunction.   Pedal edema, worse recently, suspect this may have been influenced little bit by the increase in his amlodipine dose.   Hypertensive heart disease.   Obstructive sleep apnea, on CPAP.   Hyperlipidemia, on recently increased dose of Crestor.  Last LDL July was around target at 75 but he is now running a little bit about that consistently, LDL now back up to 84.Marland Kitchen  HDL remains low chronically.   Diabetes mellitus.   Obesity, somewhat loosely back on the Ideal Protein diet, has lost about 7 pounds since last visit.  He is now supplementing with IP foods but he is not ketotic.   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   History of reactive airway disease.   Participation in [place] trial, now concluded.   S/p bilateral cataract surgery.     RECOMMENDATIONS:     Continue current cardiac medicines   Zetia was initiated within the last several weeks. We will plan on repeat lipid panel and LFTs in couple months   Check BMP as advised at last appointment given new spironolactone initiation   Follow-up with Dr. Levy Sjogren in 3-4 mo                                                     Orders Placed This Encounter   Procedures    Lipid panel    Hepatic function panel (LFT)    ECG 12 lead (Normal)    Office Visit (HRT [place])       No orders of the defined types were placed in this encounter.      SIGNED:    Lewanda Rife,  PA          This note was generated by the Dragon speech recognition and may contain errors or omissions not intended by the user. Grammatical errors, random word insertions, deletions, pronoun errors, and incomplete sentences are occasional consequences of this technology due to software limitations. Not all errors are caught or corrected. If there are questions or concerns about the content of this note or information contained within the body of this dictation, they should be addressed directly with the author for clarification.

## 2020-09-17 ENCOUNTER — Telehealth (INDEPENDENT_AMBULATORY_CARE_PROVIDER_SITE_OTHER): Payer: Self-pay

## 2020-09-17 ENCOUNTER — Encounter (INDEPENDENT_AMBULATORY_CARE_PROVIDER_SITE_OTHER): Payer: Self-pay | Admitting: Cardiovascular Disease

## 2020-09-17 NOTE — Telephone Encounter (Addendum)
10/4 1112: Called pt re: [employer]. Pain under L shoulder blade x 2-3 weeks ago. Blood pressure machine 110-70 (average), last reading yesterday. Denies CP, shortness of breath. Recommended ED for symptoms/concerns; also scheduled appt for 0830 tomorrow with RYPA.     10/4 1200: Discussed with Dr Levy Sjogren, agreeable to plan.

## 2020-09-18 ENCOUNTER — Ambulatory Visit (INDEPENDENT_AMBULATORY_CARE_PROVIDER_SITE_OTHER): Payer: Medicare Other | Admitting: Physician Assistant

## 2020-09-18 ENCOUNTER — Telehealth (INDEPENDENT_AMBULATORY_CARE_PROVIDER_SITE_OTHER): Payer: Self-pay | Admitting: Cardiovascular Disease

## 2020-09-18 ENCOUNTER — Encounter (INDEPENDENT_AMBULATORY_CARE_PROVIDER_SITE_OTHER): Payer: Self-pay | Admitting: Physician Assistant

## 2020-09-18 VITALS — BP 144/80 | HR 80 | Wt 248.0 lb

## 2020-09-18 DIAGNOSIS — Z6841 Body Mass Index (BMI) 40.0 and over, adult: Secondary | ICD-10-CM

## 2020-09-18 DIAGNOSIS — I25119 Atherosclerotic heart disease of native coronary artery with unspecified angina pectoris: Secondary | ICD-10-CM

## 2020-09-18 DIAGNOSIS — I480 Paroxysmal atrial fibrillation: Secondary | ICD-10-CM

## 2020-09-18 MED ORDER — APIXABAN 5 MG PO TABS
5.0000 mg | ORAL_TABLET | Freq: Two times a day (BID) | ORAL | 3 refills | Status: DC
Start: 2020-09-18 — End: 2020-11-15

## 2020-09-18 MED ORDER — METOPROLOL TARTRATE 25 MG PO TABS
12.5000 mg | ORAL_TABLET | Freq: Two times a day (BID) | ORAL | 1 refills | Status: DC
Start: 2020-09-18 — End: 2020-11-15

## 2020-09-18 NOTE — Telephone Encounter (Signed)
LVMTCB regarding pt's recent concern with b/p and having symptoms.

## 2020-09-18 NOTE — Progress Notes (Signed)
[place] [facility] CARDI[facility] OFFICE PROGRESS NOTE    HRT [facility] OFFICE      [place] [facility] OFFICE -CARDIOLOGY  [address]  [place] [address]  Dept: [phone]  Dept Fax: [phone]       Patient Name: Travis Hogan    Date of Visit:  September 18, 2020  Date of Birth: 1941/09/05  AGE: 79 y.o.  Medical Record #: [id number]  Requesting Physician: Conception Oms, MD      CHIEF COMPLAINT: Follow-up (left scapular pain and malaise)      HISTORY OF PRESENT ILLNESS:    He is a pleasant 79 y.o. male who presents today for acute visit d/t left scapular pain and generalized feeling "off," as he puts it. This started yesterday. He has not felt up to completing his usual exercise regimen.  No scapular pain today.  Denies chest discomfort, dyspnea recent, dizziness, palpitations, syncope.  In AF today which is a surprise to him.  He has not had any vomiting or diarrhea or other complaints.  He questions whether his home blood pressures may be elevated but his home BP cuff has been malfunctioning so he plans to get a new one.      PAST MEDICAL HISTORY: He has a past medical history of Arthritis, Asthma without status asthmaticus, Blood transfusion without reported diagnosis (1997), Coronary artery disease (2011), Diabetes mellitus type II (2007), Hyperlipidemia, Hypertensive disorder, Kidney stone (2010), Skin cancer (1980'S), and Sleep apnea. He has a past surgical history that includes Joint replacement (2010); Cholecystectomy (1997); STEM CELL KNEE (2010); Coronary angioplasty with stent (DEC 2011); Cardiac surgery (1982, 1997); and Colonoscopy (2007).    ALLERGIES:   Allergies   Allergen Reactions    Atorvastatin Other (See Comments)     Muscle Pain       MEDICATIONS:   Current Outpatient Medications:     aspirin 81 MG tablet, Take 81 mg by mouth daily., Disp: , Rfl:     cetirizine (ZYRTEC) 10 MG tablet, Take 10 mg by mouth daily., Disp: , Rfl:     ezetimibe (ZETIA) 10 MG tablet, Take  1 tablet (10 mg total) by mouth daily, Disp: 90 tablet, Rfl: 3    fenofibrate micronized (LOFIBRA) 134 MG capsule, Take 134 mg by mouth every morning before breakfast., Disp: , Rfl:     losartan-hydrochlorothiazide (HYZAAR) 100-12.5 MG per tablet, Take 1 tablet by mouth every morning., Disp: , Rfl:     Pyridoxine HCl (VITAMIN B-6) 100 MG tablet, Take 100 mg by mouth daily., Disp: , Rfl:     rosuvastatin (CRESTOR) 40 MG tablet, Take 40 mg by mouth daily  , Disp: , Rfl:     spironolactone (ALDACTONE) 25 MG tablet, Take 1 tablet (25 mg total) by mouth daily, Disp: 90 tablet, Rfl: 3    Vitamin D3 (CHOLECALCIFEROL) 125 MCG (5000 UT) Tab tablet, Take 5,000 Units by mouth once a week  , Disp: , Rfl:     apixaban (ELIQUIS) 5 MG, Take 1 tablet (5 mg total) by mouth every 12 (twelve) hours, Disp: 180 tablet, Rfl: 3    metoprolol tartrate (LOPRESSOR) 25 MG tablet, Take 0.5 tablets (12.5 mg total) by mouth 2 (two) times daily, Disp: 180 tablet, Rfl: 1     FAMILY HISTORY: family history is not on file.    SOCIAL HISTORY: He reports that he has never smoked. He has never used smokeless tobacco. He reports that he does not drink alcohol and does  not use drugs.    PHYSICAL EXAMINATION    Visit Vitals  BP 144/80 (BP Site: Left arm, Patient Position: Sitting, Cuff Size: Medium)   Pulse 80   Wt 112.5 kg (248 lb)   BMI 40.03 kg/m       General Appearance:  A well-appearing male in no acute distress.    Skin: Warm and dry to touch  Head: Normocephalic, normal hair pattern  Eyes: EOMS Intact  ENT: Masked  Neck:  no carotid bruit  Chest: Clear to auscultation bilaterally  Cardiovascular: Regular rhythm, no overt murmur  Abdomen: No guarding  Extremities: Warm without edema.    Neuro: Alert and oriented x3.       ECG: AF      LABS:   Lab Results   Component Value Date    WBC 12.40 (H) 11/17/2010    HGB 14.0 11/17/2010    HCT 40.3 11/17/2010    PLT 316 11/17/2010     Lab Results   Component Value Date    GLU 141 (H) 11/17/2010     BUN 26 (H) 11/17/2010    CREAT 1.68 (H) 11/17/2010    NA 135 11/17/2010    K 4.0 11/17/2010    CL 99 (L) 11/17/2010    CO2 27 11/17/2010    AST 21 11/17/2010    ALT 26 11/17/2010     No results found for: MG, TSH, HGBA1C, BNP  No results found for: CHOL, TRIG, HDL, LDL       IMPRESSION:  [name] a 78 y.o.malewith the following problems:     Atrial fibrillation, new diagnosis, rate controlled. CHADSVASc at least 5 for age, htn, dm, cad     Left scapular pain, possible angina    Coronary artery disease.  ? 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  ? Subsequent NSTEMI in 2012, status post stenting to his native circumflex with residual total occlusions of two vein grafts that were too small to intervene on.  ? 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 Has been medically managed since then.   Intolerance to Imdur (headache) and Ranexa (dizziness).   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   Hypertension, controlled   Echocardiogram May 2021: EF 55 to 60%, grade 1 diastolic dysfunction.   Pedal edema, worse recently, suspect this may have been influenced little bit by the increase in his amlodipine dose.   Hypertensive heart disease.   Obstructive sleep apnea, on CPAP.   Hyperlipidemia, on recently increased dose of Crestor. Last LDL July was around target at 75 but he is now running a little bit about that consistently, LDL now back up to 84.Marland Kitchen HDL remains low chronically.   Diabetes mellitus.   Obesity, somewhat loosely back on the Ideal Protein diet, has lost about 7 pounds since last visit. He is now supplementing with IP foods but he is not ketotic.   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   History of reactive airway disease.   Participation in [place] trial, now concluded.   S/pbilateral cataract surgery.      RECOMMENDATIONS:  Discussed below plan with Dr Levy Sjogren and he is in agreement    Start eliquis 5 mg BID for stroke risk reduction    TTE   Nuclear stress test   Blood work: CBC, BMP, Mg, TSH    Keep close f/u  as already arranged with Dr Levy Sjogren in several weeks with repeat ECG                                                   Orders Placed This Encounter   Procedures    Exercise Nuclear Stress Test    Basic Metabolic Panel    CBC without differential    TSH    Magnesium    ECG 12 lead    Echo       Orders Placed This Encounter   Medications    apixaban (ELIQUIS) 5 MG     Sig: Take 1 tablet (5 mg total) by mouth every 12 (twelve) hours     Dispense:  180 tablet     Refill:  3    metoprolol tartrate (LOPRESSOR) 25 MG tablet     Sig: Take 0.5 tablets (12.5 mg total) by mouth 2 (two) times daily     Dispense:  180 tablet     Refill:  1       SIGNED:    Lewanda Rife, PA          This note was generated by the Dragon speech recognition and may contain errors or omissions not intended by the user. Grammatical errors, random word insertions, deletions, pronoun errors, and incomplete sentences are occasional consequences of this technology due to software limitations. Not all errors are caught or corrected. If there are questions or concerns about the content of this note or information contained within the body of this dictation, they should be addressed directly with the author for clarification.

## 2020-09-18 NOTE — Patient Instructions (Signed)
Start eliquis 5 mg twice daily to lower stroke risk from atrial fibrillation.  Start half tablet of metoprolol (12.5 mg)   Keep upcoming appointment later this month as scheduled  Schedule echo and stress test  Blood work

## 2020-09-20 ENCOUNTER — Ambulatory Visit
Admission: RE | Admit: 2020-09-20 | Discharge: 2020-09-20 | Disposition: A | Discharge status: Home / Self Care | Payer: Medicare Other | Source: Ambulatory Visit | Attending: Physician Assistant | Admitting: Physician Assistant

## 2020-09-20 DIAGNOSIS — I480 Paroxysmal atrial fibrillation: Secondary | ICD-10-CM | POA: Insufficient documentation

## 2020-09-20 LAB — CBC
Absolute NRBC: 0 10*3/uL (ref 0.00–0.00)
Hematocrit: 44.7 % (ref 37.6–49.6)
Hgb: 15.5 g/dL (ref 12.5–17.1)
MCH: 31.1 pg (ref 25.1–33.5)
MCHC: 34.7 g/dL (ref 31.5–35.8)
MCV: 89.6 fL (ref 78.0–96.0)
MPV: 9.6 fL (ref 8.9–12.5)
Nucleated RBC: 0 /100 WBC (ref 0.0–0.0)
Platelets: 292 10*3/uL (ref 142–346)
RBC: 4.99 10*6/uL (ref 4.20–5.90)
RDW: 13 % (ref 11–15)
WBC: 7.56 10*3/uL (ref 3.10–9.50)

## 2020-09-20 LAB — BASIC METABOLIC PANEL
Anion Gap: 9 (ref 5.0–15.0)
BUN: 22 mg/dL (ref 9.0–28.0)
CO2: 24 mEq/L (ref 21–29)
Calcium: 9.5 mg/dL (ref 7.9–10.2)
Chloride: 105 mEq/L (ref 100–111)
Creatinine: 1.2 mg/dL (ref 0.5–1.5)
Glucose: 180 mg/dL — ABNORMAL HIGH (ref 70–100)
Potassium: 4.3 mEq/L (ref 3.5–5.1)
Sodium: 138 mEq/L (ref 136–145)

## 2020-09-20 LAB — TSH: TSH: 2.12 u[IU]/mL (ref 0.35–4.94)

## 2020-09-20 LAB — HEMOLYSIS INDEX: Hemolysis Index: 11 (ref 0–18)

## 2020-09-20 LAB — GFR: EGFR: 58.1

## 2020-09-20 LAB — MAGNESIUM: Magnesium: 1.6 mg/dL (ref 1.6–2.6)

## 2020-09-21 ENCOUNTER — Encounter (INDEPENDENT_AMBULATORY_CARE_PROVIDER_SITE_OTHER): Payer: Self-pay | Admitting: Cardiovascular Disease

## 2020-10-02 ENCOUNTER — Ambulatory Visit (INDEPENDENT_AMBULATORY_CARE_PROVIDER_SITE_OTHER): Payer: Medicare Other

## 2020-10-02 DIAGNOSIS — I480 Paroxysmal atrial fibrillation: Secondary | ICD-10-CM

## 2020-10-02 MED ORDER — TECHNETIUM TC 99M TETROFOSMIN INJECTION
1.0000 | Freq: Once | Status: AC | PRN
Start: 2020-10-02 — End: 2020-10-02
  Administered 2020-10-02: 1 via INTRAVENOUS

## 2020-10-09 ENCOUNTER — Encounter (INDEPENDENT_AMBULATORY_CARE_PROVIDER_SITE_OTHER): Payer: Self-pay | Admitting: Cardiovascular Disease

## 2020-10-09 ENCOUNTER — Encounter (INDEPENDENT_AMBULATORY_CARE_PROVIDER_SITE_OTHER): Payer: Self-pay

## 2020-10-09 ENCOUNTER — Ambulatory Visit (INDEPENDENT_AMBULATORY_CARE_PROVIDER_SITE_OTHER): Payer: Medicare Other | Admitting: Cardiovascular Disease

## 2020-10-09 VITALS — BP 140/78 | HR 66 | Ht 65.0 in | Wt 249.6 lb

## 2020-10-09 DIAGNOSIS — I2510 Atherosclerotic heart disease of native coronary artery without angina pectoris: Secondary | ICD-10-CM

## 2020-10-09 DIAGNOSIS — I10 Essential (primary) hypertension: Secondary | ICD-10-CM

## 2020-10-09 DIAGNOSIS — I480 Paroxysmal atrial fibrillation: Secondary | ICD-10-CM

## 2020-10-09 DIAGNOSIS — I4891 Unspecified atrial fibrillation: Secondary | ICD-10-CM

## 2020-10-09 NOTE — Progress Notes (Signed)
[place] [facility] C[facility] OFFICE PROGRESS NOTE    HRT [facility] OFFICE      [place] [facility] OFFICE -CARDIOLOGY  [address]  [place] [address]  Dept: [phone]  Dept Fax: [phone]       Patient Name: Travis Hogan    Date of Visit:  October 09, 2020  Date of Birth: [birth year]  AGE: 79 y.o.  Medical Record #: [id number]  Requesting Physician: Conception Oms, MD      CHIEF COMPLAINT: Atrial Fibrillation and Follow-up      HISTORY OF PRESENT ILLNESS:    He is a pleasant 79 y.o. male who presents today for follow-up to recently diagnosed atrial fibrillation.  He had presented to her office on October 5 having not been feeling well, beginning the day before, on October 4.  The symptoms are quite vague, just feels little bit queasy and nauseous, almost like he had had a gastrointestinal reaction to something eaten.  He was found to be in atrial fibrillation, with controlled ventricular spots, which is new for him.  He was appropriate start on anticoagulation, and his beta-blocker was addressed as a rate controlling agent.    A subsequent nuclear stress test was performed which showed a small inferoapical scar.  Ejection fraction was low normal at 49%.  His echocardiogram is scheduled in approximately 3 weeks.    He remains in atrial fibrillation by his stress test EKGs and his exam today.  Is not clear to me how symptomatic he is at this point.  With the symptoms he had on the day he presented, are long since resolved.  It is possible that that may have been herald of the trigger, rather than the symptom of the A. fib itself.  Nonetheless we talked about this at length, and I indicated in general, I recommend at least one attempt to try to get him back in sinus rhythm to see if he can tell the difference then decide on a long-term strategy from there.      PAST MEDICAL HISTORY: He has a past medical history of Arthritis, Asthma without status asthmaticus, Blood transfusion without  reported diagnosis (1997), Coronary artery disease (2011), Diabetes mellitus type II (2007), Hyperlipidemia, Hypertensive disorder, Kidney stone (2010), Skin cancer (1980'S), and Sleep apnea. He has a past surgical history that includes Joint replacement (2010); Cholecystectomy (1997); STEM CELL KNEE (2010); Coronary angioplasty with stent (DEC 2011); Cardiac surgery (1982, 1997); and Colonoscopy (2007).    ALLERGIES:   Allergies   Allergen Reactions    Atorvastatin Other (See Comments)     Muscle Pain       MEDICATIONS:   Current Outpatient Medications:     apixaban (ELIQUIS) 5 MG, Take 1 tablet (5 mg total) by mouth every 12 (twelve) hours, Disp: 180 tablet, Rfl: 3    cetirizine (ZYRTEC) 10 MG tablet, Take 10 mg by mouth daily., Disp: , Rfl:     ezetimibe (ZETIA) 10 MG tablet, Take 1 tablet (10 mg total) by mouth daily, Disp: 90 tablet, Rfl: 3    fenofibrate micronized (LOFIBRA) 134 MG capsule, Take 134 mg by mouth every morning before breakfast., Disp: , Rfl:     losartan-hydrochlorothiazide (HYZAAR) 100-12.5 MG per tablet, Take 1 tablet by mouth every morning., Disp: , Rfl:     metoprolol tartrate (LOPRESSOR) 25 MG tablet, Take 0.5 tablets (12.5 mg total) by mouth 2 (two) times daily, Disp: 180 tablet, Rfl: 1    Pyridoxine HCl (VITAMIN B-6)  100 MG tablet, Take 100 mg by mouth daily., Disp: , Rfl:     rosuvastatin (CRESTOR) 40 MG tablet, Take 40 mg by mouth daily  , Disp: , Rfl:     spironolactone (ALDACTONE) 25 MG tablet, Take 1 tablet (25 mg total) by mouth daily, Disp: 90 tablet, Rfl: 3    Vitamin D3 (CHOLECALCIFEROL) 125 MCG (5000 UT) Tab tablet, Take 5,000 Units by mouth once a week  , Disp: , Rfl:     aspirin 81 MG tablet, Take 81 mg by mouth daily., Disp: , Rfl:      FAMILY HISTORY: family history is not on file.    SOCIAL HISTORY: He reports that he has never smoked. He has never used smokeless tobacco. He reports that he does not drink alcohol and does not use drugs.    PHYSICAL  EXAMINATION    Visit Vitals  BP 140/78 (BP Site: Left arm, Patient Position: Sitting, Cuff Size: Large)   Pulse 66   Ht 1.651 m (5\' 5" )   Wt 113.2 kg (249 lb 9.6 oz)   BMI 41.54 kg/m       General Appearance:  A well-appearing male in no acute distress.    Skin: Warm and dry to touch  Head: Normocephalic, normal hair pattern  Eyes: EOMS Intact  ENT: Masked  Neck:  no carotid bruit  Chest: Clear to auscultation bilaterally  Cardiovascular: Irregularly irregular, normal S1 and S2, soft systolic ejection murmur at the base.  Abdomen: No guarding  Extremities: Warm without edema.    Neuro: Alert and oriented x3.           LABS:   Lab Results   Component Value Date    WBC 7.56 09/20/2020    HGB 15.5 09/20/2020    HCT 44.7 09/20/2020    PLT 292 09/20/2020     Lab Results   Component Value Date    GLU 180 (H) 09/20/2020    BUN 22.0 09/20/2020    CREAT 1.2 09/20/2020    NA 138 09/20/2020    K 4.3 09/20/2020    CL 105 09/20/2020    CO2 24 09/20/2020    AST 21 11/17/2010    ALT 26 11/17/2010     Lab Results   Component Value Date    MG 1.6 09/20/2020    TSH 2.12 09/20/2020     No results found for: CHOL, TRIG, HDL, LDL       IMPRESSION:  Travis Hogan a 78 y.o.malewith the following problems:     Atrial fibrillation, new diagnosis as of 09/18/2020, rate controlled    CHADSVASc at least 5 for age, htn, dm, cad     Unclear duration, but seems like it have begun at the beginning of October.   Remains in atrial fibrillation at this time, unclear how symptomatic usually is.   Coronary artery disease.  ? 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  ? Subsequent NSTEMI in 2012, status post stenting to his native circumflex with residual total occlusions of two vein grafts that were too small to intervene on.  ? 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 Has been medically managed since  then.  ? 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   Intolerance to Imdur (headache) and Ranexa (dizziness).   History of bradycardia,  which has limited use of beta blocker.   Hypertension, controlled   Echocardiogram May 2021: EF 55 to 60%, grade 1 diastolic dysfunction.   Pedal edema, worse recently, suspect this may have been influenced little bit by the increase in his amlodipine dose.   Hypertensive heart disease.   Obstructive sleep apnea, on CPAP.   Hyperlipidemia, on recently increased dose of Crestor. Last LDL July was around target at 75 but he is now running a little bit about that consistently, LDL now back up to 84.Marland Kitchen HDL remains low chronically.   Diabetes mellitus.   Obesity, somewhat loosely back on the Ideal Protein diet, has lost about 7 pounds since last visit. He is now supplementing with IP foods but he is not ketotic.   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   History of reactive airway disease.   Participation in [place] trial, now concluded.   S/pbilateral cataract surgery.     RECOMMENDATIONS:     Continue Eliquis 5 mg p.o. twice daily.  He has not missed any doses.   Echocardiogram as planned in early November.   Would recommend at least an initial attempt to get him back in sinus rhythm and then decide up on committing to a rhythm strategy if he is clearly symptomatically better in sinus rhythm.   We will plan elective cardioversion at [facility] in approximately 2 weeks.  He has not missed any doses of Eliquis.   Continue low-dose low-dose beta-blocker, heart rate has been low in the past limiting dosing.   Discussed trial data with him regarding the potential benefits of a rhythm control strategy with early diagnosis.  Patient wants to think about this, we can see how he feels interactive sinus rhythm, and then decide after attempted cardioversion.   If he maintains sinus rhythm and can clearly  feel a difference, my inclination would be to start him on Tikosyn.  Bradycardia could be an issue with amiodarone.  This will require hospitalization, so further discussion will be held with the patient in follow-up.                                                   No orders of the defined types were placed in this encounter.      No orders of the defined types were placed in this encounter.      SIGNED:    Lelan Pons, MD          This note was generated by the Dragon speech recognition and may contain errors or omissions not intended by the user. Grammatical errors, random word insertions, deletions, pronoun errors, and incomplete sentences are occasional consequences of this technology due to software limitations. Not all errors are caught or corrected. If there are questions or concerns about the content of this note or information contained within the body of this dictation, they should be addressed directly with the author for clarification.

## 2020-10-09 NOTE — Progress Notes (Signed)
[facility]   GENERAL CARDIOLOGY PROCEDURE SCHEDULING SHEET    PATIENT NAME: Travis Hogan  MEDICAL RECORD NUMBER: [id number]  DOB: 02-27-1941 (79 y.o.)      Procedure Ordered: Cardioversion  Ordering Diagnosis: I48.91  Preferred Procedure Date: 2 weeks   Preferred Hospital: [facility]  Preferred Doctor: Any       ALLERGIES:   Allergies   Allergen Reactions    Atorvastatin Other (See Comments)     Muscle Pain       IODINE ALLERGY: (YES/NO) NO    If Yes, for procedures with contrast, the patient should take:  Prednisone 50mg  - 13 hours, 7 hours and 1 hour (bring last dose to hospital) prior to the procedure   Benadryl 50 mg - 1 hour prior to the procedure (bring dose to hospital)                 DIABETIC: (YES/NO) NO    If Yes, patient should hold their X the morning of the procedure.    CREATININE LEVEL > 1.5: (YES/NO) NO    Last Cr on file:   Lab Results   Component Value Date    CREAT 1.2 09/20/2020        If Yes, for procedures with contrast, patient should increase their fluid intake the day prior to the procedure.   Patient should hold their XX 24 hours prior to the procedure.   Normal Saline should be started at 2cc/kg/hr 1 hour prior to the procedure (unless EF is less than 35% then provider should be consulted)     IS PATIENT ON ANTICOAGULATION: (YES/NO) YES   (instructions to continue or hold their anticoagulant must be addressed)    If Yes, patient should CONTINUE ELIQUIS AND ASA, NEED 2 WEEKS OF THERAPY PRIOR TO CARDIOVERSION     IS PATIENT TAKING DIURETICS: (YES/NO) YES    If Yes, patient should hold their SPIRONOLACTONE AND LOSARTAN/HCTZ  the morning of the procedure.     ADDITIONAL MEDICATIONS NEEDED:     CONSENT FORMS SIGNED IN IMED (YES/NO):NO    OR FAXED (for non [place] hospitals): (YES/NO):     PRE OP BLOODWORK ORDERED: (YES/NO): YES GIVEN TO PT     Is this a TEE ordered to be done at [facility]: (YES/NO) NO    If Yes, does the patient have severe RV Dysfunction or severe Pulmonary  Hypertension (RVSP >29mmHg) mentioned on their  most recent echo: (YES/NO)       EF:                          AREA BELOW DOTTED LINE TO BE COMPLETED BY HOSPITAL SCHEDULERS ONLY  -------------------------------------------------------------------------------------------    INSURANCE PHONE#:________________________ ID#:____________________ GROUP#:_________________    DATE PRE-CERTED:_________________________   CONTACT NAME:____________________________________    PACER REP CALLED:________________________ AUTH#:___________________________________________    PT CALLED:_________ LAB:_________ ADMITTING:_________ ADMIT DATE:_________ ORDER/APPT______    INSTRUCTION SHEET MAILED:________________   DOCTOR PERFORMING:_______________________________    REQUEST INFO FROM DR. OFFICE: ________________________________________________________________    PT CHECK IN TIME:________________________   DATE & TIME OF PROCEDURE:________________________    TX FR: ___________________________________          INPT   or   OUTPT   or   ADMIT

## 2020-10-10 ENCOUNTER — Telehealth (INDEPENDENT_AMBULATORY_CARE_PROVIDER_SITE_OTHER): Payer: Self-pay

## 2020-10-10 NOTE — Telephone Encounter (Signed)
Called to schedule LVM/ZC

## 2020-10-10 NOTE — Telephone Encounter (Signed)
-----   Message from Lorie Apley sent at 10/09/2020  2:09 PM EDT -----  IFX cardioversion- please advise patient I was wrong about med list, he will hold his diuretics. He was halfway out the door when I spoke with him. Thank you

## 2020-10-11 ENCOUNTER — Encounter (INDEPENDENT_AMBULATORY_CARE_PROVIDER_SITE_OTHER): Payer: Self-pay

## 2020-10-15 HISTORY — PX: CARDIOVERSION: SHX1299

## 2020-10-16 ENCOUNTER — Ambulatory Visit
Admission: RE | Admit: 2020-10-16 | Discharge: 2020-10-16 | Disposition: A | Discharge status: Home / Self Care | Payer: Medicare Other | Source: Ambulatory Visit | Attending: Cardiovascular Disease | Admitting: Cardiovascular Disease

## 2020-10-16 DIAGNOSIS — I10 Essential (primary) hypertension: Secondary | ICD-10-CM | POA: Insufficient documentation

## 2020-10-16 LAB — CBC AND DIFFERENTIAL
Absolute NRBC: 0 10*3/uL (ref 0.00–0.00)
Basophils Absolute Automated: 0.06 10*3/uL (ref 0.00–0.08)
Basophils Automated: 0.9 %
Eosinophils Absolute Automated: 0.18 10*3/uL (ref 0.00–0.44)
Eosinophils Automated: 2.6 %
Hematocrit: 44.8 % (ref 37.6–49.6)
Hgb: 14.9 g/dL (ref 12.5–17.1)
Immature Granulocytes Absolute: 0.02 10*3/uL (ref 0.00–0.07)
Immature Granulocytes: 0.3 %
Lymphocytes Absolute Automated: 2.51 10*3/uL (ref 0.42–3.22)
Lymphocytes Automated: 35.8 %
MCH: 30.7 pg (ref 25.1–33.5)
MCHC: 33.3 g/dL (ref 31.5–35.8)
MCV: 92.2 fL (ref 78.0–96.0)
MPV: 9.6 fL (ref 8.9–12.5)
Monocytes Absolute Automated: 0.59 10*3/uL (ref 0.21–0.85)
Monocytes: 8.4 %
Neutrophils Absolute: 3.66 10*3/uL (ref 1.10–6.33)
Neutrophils: 52 %
Nucleated RBC: 0 /100 WBC (ref 0.0–0.0)
Platelets: 292 10*3/uL (ref 142–346)
RBC: 4.86 10*6/uL (ref 4.20–5.90)
RDW: 13 % (ref 11–15)
WBC: 7.02 10*3/uL (ref 3.10–9.50)

## 2020-10-16 LAB — BASIC METABOLIC PANEL
Anion Gap: 12 (ref 5.0–15.0)
BUN: 20 mg/dL (ref 9.0–28.0)
CO2: 23 mEq/L (ref 21–29)
Calcium: 9.2 mg/dL (ref 7.9–10.2)
Chloride: 103 mEq/L (ref 100–111)
Creatinine: 1.1 mg/dL (ref 0.5–1.5)
Glucose: 167 mg/dL — ABNORMAL HIGH (ref 70–100)
Potassium: 4.4 mEq/L (ref 3.5–5.1)
Sodium: 138 mEq/L (ref 136–145)

## 2020-10-16 LAB — GFR: EGFR: >60

## 2020-10-16 LAB — HEMOLYSIS INDEX: Hemolysis Index: 5 (ref 0–24)

## 2020-10-23 ENCOUNTER — Encounter (INDEPENDENT_AMBULATORY_CARE_PROVIDER_SITE_OTHER): Payer: Self-pay | Admitting: Cardiovascular Disease

## 2020-10-23 ENCOUNTER — Telehealth: Payer: Medicare Other

## 2020-10-23 ENCOUNTER — Ambulatory Visit (INDEPENDENT_AMBULATORY_CARE_PROVIDER_SITE_OTHER): Payer: Medicare Other

## 2020-10-23 DIAGNOSIS — I480 Paroxysmal atrial fibrillation: Secondary | ICD-10-CM

## 2020-10-23 NOTE — Pre-Procedure Instructions (Signed)
Procedure Verified:  Cardioversion  Pt ID verified: yes  Patient received med instructions from MD office: some; pt will call for instruction clarification  Procedure requirements: Labs  Labs:  10/16/20 - CBC, BMP WDL in Epic  Future plan/Upcoming appointments (labs/COVID): none  Pt confirms that all doses of Eliquis have been taken for the last 3 weeks.   Escalated to PEC Nav: n/a

## 2020-10-26 ENCOUNTER — Ambulatory Visit: Payer: Medicare Other | Admitting: Anesthesiology

## 2020-10-26 ENCOUNTER — Ambulatory Visit
Admission: RE | Admit: 2020-10-26 | Discharge: 2020-10-26 | Disposition: A | Discharge status: Home / Self Care | Payer: Medicare Other | Source: Ambulatory Visit | Attending: Cardiovascular Disease | Admitting: Cardiovascular Disease

## 2020-10-26 ENCOUNTER — Telehealth (INDEPENDENT_AMBULATORY_CARE_PROVIDER_SITE_OTHER): Payer: Self-pay

## 2020-10-26 DIAGNOSIS — J45909 Unspecified asthma, uncomplicated: Secondary | ICD-10-CM | POA: Insufficient documentation

## 2020-10-26 DIAGNOSIS — Z955 Presence of coronary angioplasty implant and graft: Secondary | ICD-10-CM | POA: Insufficient documentation

## 2020-10-26 DIAGNOSIS — I2510 Atherosclerotic heart disease of native coronary artery without angina pectoris: Secondary | ICD-10-CM

## 2020-10-26 DIAGNOSIS — E785 Hyperlipidemia, unspecified: Secondary | ICD-10-CM | POA: Insufficient documentation

## 2020-10-26 DIAGNOSIS — I10 Essential (primary) hypertension: Secondary | ICD-10-CM | POA: Insufficient documentation

## 2020-10-26 DIAGNOSIS — Z7901 Long term (current) use of anticoagulants: Secondary | ICD-10-CM | POA: Insufficient documentation

## 2020-10-26 DIAGNOSIS — I4891 Unspecified atrial fibrillation: Secondary | ICD-10-CM

## 2020-10-26 DIAGNOSIS — Z01818 Encounter for other preprocedural examination: Secondary | ICD-10-CM

## 2020-10-26 DIAGNOSIS — G4730 Sleep apnea, unspecified: Secondary | ICD-10-CM | POA: Insufficient documentation

## 2020-10-26 DIAGNOSIS — Z9889 Other specified postprocedural states: Secondary | ICD-10-CM

## 2020-10-26 DIAGNOSIS — E119 Type 2 diabetes mellitus without complications: Secondary | ICD-10-CM | POA: Insufficient documentation

## 2020-10-26 DIAGNOSIS — Z79899 Other long term (current) drug therapy: Secondary | ICD-10-CM | POA: Insufficient documentation

## 2020-10-26 DIAGNOSIS — I480 Paroxysmal atrial fibrillation: Secondary | ICD-10-CM | POA: Insufficient documentation

## 2020-10-26 DIAGNOSIS — Z7982 Long term (current) use of aspirin: Secondary | ICD-10-CM | POA: Insufficient documentation

## 2020-10-26 DIAGNOSIS — Z6841 Body Mass Index (BMI) 40.0 and over, adult: Secondary | ICD-10-CM | POA: Insufficient documentation

## 2020-10-26 HISTORY — DX: Cardiac arrhythmia, unspecified: I49.9

## 2020-10-26 LAB — ECG 12-LEAD
Atrial Rate: 66 {beats}/min
Atrial Rate: 48 {beats}/min
P Axis: 97 degrees
P-R Interval: 232 ms
Q-T Interval: 408 ms
Q-T Interval: 446 ms
QRS Duration: 96 ms
QRS Duration: 100 ms
QTC Calculation (Bezet): 433 ms
QTC Calculation (Bezet): 398 ms
R Axis: -36 degrees
R Axis: -36 degrees
T Axis: 86 degrees
T Axis: 70 degrees
Ventricular Rate: 68 {beats}/min
Ventricular Rate: 48 {beats}/min

## 2020-10-26 LAB — GLUCOSE WHOLE BLOOD - POCT: Whole Blood Glucose POCT: 152 mg/dL — ABNORMAL HIGH (ref 70–100)

## 2020-10-26 MED ORDER — PROPOFOL INFUSION 10 MG/ML
INTRAVENOUS | Status: DC | PRN
Start: 2020-10-26 — End: 2020-10-26
  Administered 2020-10-26: 80 mg via INTRAVENOUS

## 2020-10-26 NOTE — H&P (Signed)
The history and physical currently available in the chart has been reviewed and there are no significant interval changes since prior evaluation.  He has no complaints.  He was seen and examined by me prior to the procedure.  The risks, benefits and alternatives of the procedure have been discussed in detail and he has indicated that he understands the procedure, indications, and risks inherent to the procedure and is amenable to proceeding.  All questions were answered. Informed consent was signed and verified.      Arwen Haseley, MD, FACC

## 2020-10-26 NOTE — Op Note (Signed)
[facility] CARDI[facility] CARDIOVERSION PROCEDURE NOTE    Date Time: 10/26/20 9:41 AM  Patient Name: Travis Hogan  Requesting Physician: Lelan Pons, MD          Reason for Cardioversion:    PAF    The nature of the procedure, risks and alternatives were discussed with the patient who understands and  gave informed consent for the procedure.    PROCEDURE:  Electrical cardioversion.    After electrocardiographic monitoring, blood pressure monitoring and pulse oximetry monitoring was established, patient was sedated by anesthesiology.  Anterior, posterior, biphasic, synchronous cardioversion was then performed with 200 joules with successful restoration of normal sinus rhythm.      COMPLICATIONS:  Patient was resting comfortably after the completion of the procedure.  There were no complications.     IMPRESSION:  Successful electrical cardioversion.    PLAN:    He will follow up with cardiology.       Lynford Humphrey, MD, [facility]  [facility]

## 2020-10-26 NOTE — Discharge Instructions (Signed)
Discharge Instructions for Cardioversion  Your healthcare provider did a procedure called cardioversion. He or she used a controlled electric shock or a medicine to briefly stop all electrical activity in your heart. This helped restore your heart’s normal rhythm. Here are some instructions to follow while you recover.  Home care  · Before cardioversion, you will typically be given sedation. So, you won't be able to drive home. You will need a ride. Wait at least 24 hours before driving a car or operating heavy machinery after getting sedating medicines.  · The skin on your chest may be irritated or feel like it's sunburned. Your healthcare provider may prescribe a soothing lotion to ease this discomfort. These minor symptoms will go away in a few days.  · Ask your healthcare provider about medicines to keep your heart rhythm steady.  · If you were prescribed medicine, take it as instructed by your healthcare provider. Don’t skip doses or take double doses. Cardioversion requires blood thinners for at least 4 weeks after the procedure to prevent a delayed risk of stroke when treating atrial fibrillation or atrial flutter. Be sure you discuss which medicine you are taking to prevent stroke. Ask when you need to have your medicine levels checked. Also ask whether you may be able to stop taking it in the future or whether you need to take it for life. Some of these blood-thinning medicines such as warfarin will have the dose adjusted, and interact with other medicines or foods. Your healthcare team will give you full instructions on what to watch out for. Report bleeding or symptoms of stroke immediately to your healthcare team and seek emergency medical attention.  · Learn to take your own pulse. Keep a record of your results. Ask your healthcare provider when you should seek emergency medical attention. He or she will tell you which pulse rate reading is dangerous.   · Cardioversion is usually short term  (temporary). You may need it repeated if the abnormal heart rhythm returns. After the procedure, your healthcare provider will tell you if the treatment worked or if you will need further treatments or medicine.    Follow-up care  Make a follow-up appointment, or as advised.  When to call your healthcare provider  Call 911 right away if you have:  · Chest pain  · Shortness of breath  · Loss of vision, speech, or strength or coordination in any body part  Call your healthcare provider right away if you:  · Feel faint, dizzy, or lightheaded  · Have chest pain with increased activity  · Have irregular heartbeat or fast pulse  · Have bleeding issues from blood-thinning medicines  [employer] last reviewed this educational content on 04/14/2018  © 2000-2021 [employer]. All rights reserved. This information is not intended as a substitute for professional medical care. Always follow your healthcare professional's instructions.      Post Anesthesia Discharge Instructions    Although you may be awake and alert in the recovery room, small amounts of anesthetic remain in your system for about 24 hours.  You may feel tired and sleepy during this time.      You are advised to go directly home from the hospital.    Plan to stay at home and rest for the remainder of the day.    It is advisable to have someone with you at home for 24 hours after surgery.    Do not operate a motor vehicle, or any mechanical or electrical equipment for   the next 24 hours.      Be careful when you are walking around, you may become dizzy.  The effects of anesthesia and/or medications are still present and drowsiness may occur    Do not consume alcohol, tranquilizers, sleeping medications, or any other non prescribed medication for the remainder of the day.    Diet:  begin with liquids, progress your diet as tolerated or as directed by your surgeon.  Nausea and vomiting may occur in the next 24 hours.

## 2020-10-26 NOTE — Nursing Progress Note (Signed)
Patient received to [facility] for External Cardioversion with Anesthesia procedure by Dr. Shanon Ace  for Atrial Fibrillation.    Pre Procedure:  Patient alert, oriented, normal speech, no focal findings or movement disorder noted.  Patient is accompanied by his spouse.   Patient is a DNR, discussion took place regarding patient's DNR status: N/A  22g PIV inserted in R wrist, blood return noted. Flushed without difficulty, line patent. Patient tolerated insertion well. Patient admits to taking anticoagulation and has not missed any doses: Yes.  Eliquis  Pre procedure ECG performed per protocol and placed on chart: Rhythm:  Atrial Fibrillation, Rate:  Normal    Intra-Procedure:   CARDIOVERSION:  Patient received 200J synchronized shock x  1  Successfully Cardioverted:  yes  Post procedure ECG performed per protocol EKG:  Sinus Bradycardia.  Patches removed, SKIN: intact, no lesions or ecchymosis. negative for skin breakdown. Patient encouraged to treat the area like a sunburn.    Procedure complete: yes  0940  Patient tolerated procedure well.     Post Procedure:  Patient arrives in Recovery: recovering well 09:43  Patient: alert, oriented, normal speech, no focal findings or movement disorder noted.  Back to baseline: Yes.    Discharge instructions verbally reviewed. PIV removed without difficulty. Per Dr. Shanon Ace, there  are no changes to current medications.   Discharged in stable condition. Accompanied by, spouse. Instructed to contact physician if any complications or unmanageable symptoms occur.  Patient verbalized understanding of instructions.   Patient discharged to home, at 10:15, escorted via Wheelchair into personal car in care of spouse Fleet Contras.

## 2020-10-26 NOTE — Telephone Encounter (Addendum)
-----   Message from Lewanda Rife, [place] sent at 10/22/2020 10:04 AM EST -----  Regarding: FW: Should pt stop aspirin now that he'w on Eliquis? TY  Hi Signa Cheek  I think it's fine for him to stop aspirin because his CAD has been stable for years, and recent MPI was reassuring. He should remain on eliquis. Increased bleeding risk with eliquis and aspirin co-administration. Ccing Dr Levy Sjogren as Lorain Childes. I defer to him if he recommends a different plan with the aspirin. ThanksRachel   ----- Message -----  From: Verline Lema, RN  Sent: 09/27/2020  11:25 AM EST  To: Lewanda Rife, PA  Subject: Should pt stop aspirin now that he'w on Eliq#    11/12: Verbalized understanding and agreeable to plan. States he was cardioverted tonight and needs a 2 weeks f/u. Message sent to front desk staff.

## 2020-10-26 NOTE — Anesthesia Preprocedure Evaluation (Signed)
Anesthesia Evaluation    AIRWAY    Mallampati: III    TM distance: >3 FB  Neck ROM: full    Planned to use difficult airway equipment: No CARDIOVASCULAR    cardiovascular exam normal       DENTAL    no notable dental hx     PULMONARY    pulmonary exam normal     OTHER FINDINGS                  Relevant Problems   CARDIO   (+) Atrial fibrillation   (+) CAD (coronary artery disease)               Anesthesia Plan    ASA 4     general                     intravenous induction   Detailed anesthesia plan: general IV            informed consent obtained                   Signed by: Fayette Pho, MD 10/26/20 9:19 AM

## 2020-10-26 NOTE — Anesthesia Postprocedure Evaluation (Signed)
Anesthesia Post Evaluation    Patient: Travis Hogan    * No procedures listed *    Anesthesia type: general    Last Vitals:   Vitals Value Taken Time   BP 131/73 10/26/20 0839   Temp 37.1 C (98.7 F) 10/26/20 0839   Pulse 69 10/26/20 0839   Resp 18 10/26/20 0839   SpO2 96 % 10/26/20 0839                 Anesthesia Post Evaluation:     Patient Evaluated: PACU    Level of Consciousness: awake and alert    Pain Management: adequate    Airway Patency: patent    Anesthetic complications: No      PONV Status: none    Cardiovascular status: acceptable  Respiratory status: acceptable  Hydration status: acceptable        Signed by: Fayette Pho, MD, 10/26/2020 9:41 AM

## 2020-11-15 ENCOUNTER — Encounter (INDEPENDENT_AMBULATORY_CARE_PROVIDER_SITE_OTHER): Payer: Self-pay | Admitting: Physician Assistant

## 2020-11-15 ENCOUNTER — Ambulatory Visit (INDEPENDENT_AMBULATORY_CARE_PROVIDER_SITE_OTHER): Payer: Medicare Other | Admitting: Physician Assistant

## 2020-11-15 VITALS — BP 126/82 | HR 76 | Ht 65.0 in | Wt 251.0 lb

## 2020-11-15 DIAGNOSIS — I2510 Atherosclerotic heart disease of native coronary artery without angina pectoris: Secondary | ICD-10-CM

## 2020-11-15 DIAGNOSIS — I4819 Other persistent atrial fibrillation: Secondary | ICD-10-CM

## 2020-11-15 MED ORDER — APIXABAN 5 MG PO TABS
5.0000 mg | ORAL_TABLET | Freq: Two times a day (BID) | ORAL | 3 refills | Status: DC
Start: 2020-11-15 — End: 2021-12-10

## 2020-11-15 MED ORDER — METOPROLOL TARTRATE 25 MG PO TABS
12.5000 mg | ORAL_TABLET | Freq: Two times a day (BID) | ORAL | 1 refills | Status: DC
Start: 2020-11-15 — End: 2021-07-20

## 2020-11-15 NOTE — Progress Notes (Signed)
[facility] C[facility] OFF[facility] PROGRESS NOTE    [facility]RT [facility] OFFICE       [facility] OFFICE -CARDIOLOGY  [address]  [place] [address]  Dept: [phone]  Dept Fax: [phone]       Patient Name: Travis Hogan    Date of Visit:  November 15, 2020  Date of Birth: [birth year]  AGE: 79 y.o.  Medical Record #: [id number]  Requesting Physician: Conception Oms, MD      CHIEF COMPLAINT: Hospital Follow-up and Follow-up      HISTORY OF PRESENT ILLNESS:    He is a pleasant 79 y.o. male who presents today for hospital follow-up of AF after recent DCCV.  Unfortunately, he is back in AF on today's ECG.  He says he does not feel particularly symptomatic and has no dyspnea over baseline, chest discomfort, dizziness, or profound fatigue.  He has remained compliant with his Eliquis and other cardiac medicines.  Denies bleeding issues.  He is interested in other rhythm control options and asks specifically whether it would be reasonable to try another cardioversion.  His recent echocardiogram showed a severely dilated LA.  His DCCV several weeks ago was initially successful with return to sinus in the lab, but after he went home and checked his blood pressure several hours later with his Omron cuff, he had an irregular heartbeat alert from the device suggesting return to AF that very same day.  He exercises routinely with a recumbent bicycle.    PAST MEDICAL HISTORY: He has a past medical history of Arrhythmia, Arthritis, Asthma without status asthmaticus, Blood transfusion without reported diagnosis (1997), Coronary artery disease (2011), Diabetes mellitus type II (2007), Hyperlipidemia, Hypertensive disorder, Kidney stone (2010), Skin cancer (1980'S), and Sleep apnea. He has a past surgical history that includes Joint replacement (2010); Cholecystectomy (1997); STEM CELL KNEE (2010); Coronary angioplasty with stent (DEC 2011); Cardiac surgery (1982, 1997); and Colonoscopy  (2007).    ALLERGIES:   Allergies   Allergen Reactions    Atorvastatin Other (See Comments)     Muscle Pain       MEDICATIONS:   Current Outpatient Medications:     apixaban (ELIQUIS) 5 MG, Take 1 tablet (5 mg total) by mouth every 12 (twelve) hours, Disp: 180 tablet, Rfl: 3    Calcium-Magnesium 100-50 MG Tab, Take by mouth daily, Disp: , Rfl:     cetirizine (ZYRTEC) 10 MG tablet, Take 10 mg by mouth daily., Disp: , Rfl:     ezetimibe (ZETIA) 10 MG tablet, Take 1 tablet (10 mg total) by mouth daily, Disp: 90 tablet, Rfl: 3    fenofibrate micronized (LOFIBRA) 134 MG capsule, Take 134 mg by mouth every morning before breakfast., Disp: , Rfl:     losartan-hydrochlorothiazide (HYZAAR) 100-12.5 MG per tablet, Take 1 tablet by mouth every morning., Disp: , Rfl:     metoprolol tartrate (LOPRESSOR) 25 MG tablet, Take 0.5 tablets (12.5 mg total) by mouth 2 (two) times daily, Disp: 180 tablet, Rfl: 1    Potassium 99 MG Tab, Take 1 tablet by mouth daily, Disp: , Rfl:     Pyridoxine HCl (VITAMIN B-6) 100 MG tablet, Take 100 mg by mouth daily., Disp: , Rfl:     rosuvastatin (CRESTOR) 40 MG tablet, Take 40 mg by mouth daily  , Disp: , Rfl:     spironolactone (ALDACTONE) 25 MG tablet, Take 1 tablet (25 mg total) by mouth daily, Disp: 90 tablet, Rfl: 3  Vitamin D3 (CHOLECALCIFEROL) 125 MCG (5000 UT) Tab tablet, Take 5,000 Units by mouth once a week Takes on Monday , Disp: , Rfl:     vitamins/minerals Tab, Take 1 tablet by mouth daily, Disp: , Rfl:      FAMILY HISTORY: family history is not on file.    SOCIAL HISTORY: He reports that he has never smoked. He has never used smokeless tobacco. He reports that he does not drink alcohol and does not use drugs.    PHYSICAL EXAMINATION    Visit Vitals  BP 126/82 (BP Site: Left arm, Patient Position: Sitting, Cuff Size: Medium)   Pulse 76   Ht 1.651 m (5\' 5" )   Wt 113.9 kg (251 lb)   BMI 41.77 kg/m       General Appearance:  A well-appearing male in no acute distress.     Skin: Warm and dry to touch   Head: Normocephalic  Eyes: EOMS Intact   ENT: masked  Neck: JVP normal, no carotid bruit  Chest: Clear to auscultation bilaterally    Cardiovascular: Irregular without overt murmur  Abdomen: No guarding  Extremities: Warm without pitting edema.   Neuro: Alert and oriented x3. No gross motor or sensory deficits noted, affect appropriate.        ECG: AF      LABS:   Lab Results   Component Value Date    WBC 7.02 10/16/2020    HGB 14.9 10/16/2020    HCT 44.8 10/16/2020    PLT 292 10/16/2020     Lab Results   Component Value Date    GLU 167 (H) 10/16/2020    BUN 20.0 10/16/2020    CREAT 1.1 10/16/2020    NA 138 10/16/2020    K 4.4 10/16/2020    CL 103 10/16/2020    CO2 23 10/16/2020    AST 21 11/17/2010    ALT 26 11/17/2010     Lab Results   Component Value Date    MG 1.6 09/20/2020    TSH 2.12 09/20/2020     No results found for: CHOL, TRIG, HDL, LDL        IMPRESSION:  [name] a 78 y.o.malewith the following problems:     Persistent atrial fibrillation, new diagnosis as of 09/18/2020, rate controlled, and back in AF after Nov 2021 DCCV  ?  CHADSVASc at least 5 for age, htn, dm, cad    ? Unclear duration, but seems like it have begun at the beginning of October.  ? Remains in atrial fibrillation at this time, unclear how symptomatic usually is.   TTE  Nov 2021 EF, trace to mild AI, severely dilated LA   Coronary artery disease.  ? 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  ? Subsequent NSTEMI in 2012, status post stenting to his native circumflex with residual total occlusions of two vein grafts that were too small to intervene on.  ? 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 Has been medically managed since then.  ? 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   Intolerance to Imdur  (headache) and Ranexa (dizziness).   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   Hypertension,controlled   Echocardiogram May 2021: EF 55 to 60%, grade 1 diastolic dysfunction.   Pedal edema, worse recently, suspect  this may have been influenced little bit by the increase in his amlodipine dose.   Hypertensive heart disease.   Obstructive sleep apnea, on CPAP.   Hyperlipidemia, on recently increased dose of Crestor. Last LDL July was around target at 75 but he is now running a little bit about that consistently, LDL now back up to 84.Marland Kitchen HDL remains low chronically.   Diabetes mellitus.   Obesity, somewhat loosely back on the Ideal Protein diet, has lost about 7 pounds since last visit. He is now supplementing with IP foods but he is not ketotic.   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   History of reactive airway disease.   Participation in Lorenzo trial, now concluded.   S/pbilateral cataract surgery.     RECOMMENDATIONS:     Continue Eliquis 5 mg p.o. twice daily and other cardiac medicines   He asks today about whether another DCCV can be attempted.  I explained that his severely dilated LA on echo means it will be more difficult to keep him in sinus, and since he feels that he is relatively asymptomatic from AF, a rate control strategy is not unreasonable.  Since he is interested in discussing this further, I will arrange a soon consult with our EP team   Follow-up with Dr. Levy Sjogren in several months                                                    Orders Placed This Encounter   Procedures    ECG 12 lead (Normal)    ECG 12 lead (Normal)    EP Consult ([facility])    Office Visit ([facility])       Orders Placed This Encounter   Medications    apixaban (ELIQUIS) 5 MG     Sig: Take 1 tablet (5 mg total) by mouth every 12 (twelve) hours     Dispense:  180 tablet     Refill:  3    metoprolol tartrate (LOPRESSOR) 25 MG tablet     Sig: Take 0.5 tablets (12.5 mg total) by  mouth 2 (two) times daily     Dispense:  180 tablet     Refill:  1       SIGNED:    Lewanda Rife, PA          This note was generated by the Dragon speech recognition and may contain errors or omissions not intended by the user. Grammatical errors, random word insertions, deletions, pronoun errors, and incomplete sentences are occasional consequences of this technology due to software limitations. Not all errors are caught or corrected. If there are questions or concerns about the content of this note or information contained within the body of this dictation, they should be addressed directly with the author for clarification.

## 2020-11-29 ENCOUNTER — Ambulatory Visit (INDEPENDENT_AMBULATORY_CARE_PROVIDER_SITE_OTHER): Payer: Medicare Other | Admitting: Cardiovascular Disease

## 2020-11-29 ENCOUNTER — Encounter (INDEPENDENT_AMBULATORY_CARE_PROVIDER_SITE_OTHER): Payer: Self-pay | Admitting: Cardiovascular Disease

## 2020-11-29 VITALS — BP 118/68 | HR 76 | Ht 65.0 in | Wt 249.0 lb

## 2020-11-29 DIAGNOSIS — I10 Essential (primary) hypertension: Secondary | ICD-10-CM

## 2020-11-29 DIAGNOSIS — I2510 Atherosclerotic heart disease of native coronary artery without angina pectoris: Secondary | ICD-10-CM

## 2020-11-29 DIAGNOSIS — I4819 Other persistent atrial fibrillation: Secondary | ICD-10-CM

## 2020-11-29 NOTE — Progress Notes (Signed)
[place] [facility] ELECTROPHYSIOLOGY OFFICE CONSULTATION NOTE    HRT [facility] OFFICE      [place] [facility] OFFICE [facility]  [address]  [place] [address]  Dept: [phone]  Dept Fax: [phone]         Patient Name: Travis Hogan    Date of Visit:  November 29, 2020  Date of Birth: Mar 10, 1941  AGE: 79 y.o.  Medical Record #: [id number]    Primary Electrophysiologist: Ida Rogue MD, [facility]    Primary Cardiologist: Bruce Donath MD, [facility]      CHIEF COMPLAINT:  Atrial Fibrillation (EP consult)      HISTORY OF PRESENT ILLNESS    Mr. Middleton is being seen today for Electrophysiology evaluation at the request of Bruce Donath MD, [facility] He is a pleasant 79 y.o. male who had some worsening edema in May with medication adjustment.  In July he had been feeling well and EKG with significant artifactual irregularity.  In October he was seen to have atrial fibrillation with controlled rates and anticoagulation was started.  Echocardiogram demonstrated normal left ventricular systolic function with left atrial enlargement with left atrial volume of 88 cc.  He is treated for sleep apnea.  He is working on losing weight.  He had electrical cardioversion in November but after going home he had a blood pressure cuff was noted irregular heart rate and he was seen to be in atrial fibrillation.  He believes his functional status has been stable without decreasing his ability to exercise.    PAST MEDICAL HISTORY: He has a past medical history of Arrhythmia, Arthritis, Asthma without status asthmaticus, Blood transfusion without reported diagnosis (1997), Coronary artery disease (2011), Diabetes mellitus type II (2007), Hyperlipidemia, Hypertensive disorder, Kidney stone (2010), Skin cancer (1980'S), and Sleep apnea. He has a past surgical history that includes Joint replacement (2010); Cholecystectomy (1997); STEM CELL KNEE (2010); Coronary angioplasty with stent (DEC 2011); Cardiac surgery (1982,  1997); and Colonoscopy (2007).    ALLERGIES:   Allergies   Allergen Reactions    Atorvastatin Other (See Comments)     Muscle Pain       MEDICATIONS:   Current Outpatient Medications   Medication Sig    apixaban (ELIQUIS) 5 MG Take 1 tablet (5 mg total) by mouth every 12 (twelve) hours    aspirin EC 81 MG EC tablet Take 81 mg by mouth daily    Calcium-Magnesium 100-50 MG Tab Take by mouth daily    cetirizine (ZYRTEC) 10 MG tablet Take 10 mg by mouth daily.    ezetimibe (ZETIA) 10 MG tablet Take 1 tablet (10 mg total) by mouth daily    fenofibrate micronized (LOFIBRA) 134 MG capsule Take 134 mg by mouth every morning before breakfast.    losartan-hydrochlorothiazide (HYZAAR) 100-12.5 MG per tablet Take 1 tablet by mouth every morning.    metoprolol tartrate (LOPRESSOR) 25 MG tablet Take 0.5 tablets (12.5 mg total) by mouth 2 (two) times daily    Potassium 99 MG Tab Take 1 tablet by mouth daily    Pyridoxine HCl (VITAMIN B-6) 100 MG tablet Take 100 mg by mouth daily.    rosuvastatin (CRESTOR) 40 MG tablet Take 40 mg by mouth daily       spironolactone (ALDACTONE) 25 MG tablet Take 1 tablet (25 mg total) by mouth daily    Vitamin D3 (CHOLECALCIFEROL) 125 MCG (5000 UT) Tab tablet Take 5,000 Units by mouth once a week Takes on Monday  vitamins/minerals Tab Take 1 tablet by mouth daily        FAMILY HISTORY: family history is not on file.    SOCIAL HISTORY: He reports that he has never smoked. He has never used smokeless tobacco. He reports that he does not drink alcohol and does not use drugs.    REVIEW OF SYSTEMS:   General: intentional weight loss  Integumentary: Denies any change in hair or nails, rashes, or skin lesions.;   Eyes: Denies diplopia, glaucoma or visual field defects.;   Ears, Nose, Throat, Mouth: Denies any hearing loss, epistaxis, hoarseness or difficulty speaking.;  Respiratory: Denies dyspnea, cough, wheezing or hemoptysis.;   Cardiovascular: Please review HPI;   Abdominal : Denies  ulcer disease, hematochezia or melena.;  Musculoskeletal:Denies any venous insufficiency, arthritic symptoms or back problems.;   Neurological : Denies any recurrent strokes, TIA, or seizure disorder.;   Psychiatric: Denies any depression, substance abuse or change in cognitive functions.;   Endocrine: Denies any weight change, heat/cold intolerance, polydipsia, or polyuria;   Hematologic/Immunologic: Denies any food allergies, seasonal allergies, bleeding disorders.   All other systems reviewed and negative except as above.     PHYSICAL EXAMINATION  Visit Vitals  BP 118/68 (BP Site: Left arm, Patient Position: Sitting, Cuff Size: Large)   Pulse 76   Ht 1.651 m (5\' 5" )   Wt 112.9 kg (249 lb)   BMI 41.44 kg/m     General Appearance:  A well-appearing male in no acute distress.    Skin: Warm and dry to touch, no apparent skin lesions, or masses noted.  Head: Normocephalic, normal hair pattern.  Eyes: EOMS Intact, conjunctivae and lids unremarkable.    Neck: JVP normal, no carotid bruit, thyroid not enlarged  Chest: Clear to auscultation bilaterally with good air movement and respiratory effort and no wheezes, rales, or rhonchi   Cardiovascular: iregular rhythm, S1 normal, S2 normal, No S3 or S4. Apical impulse not displaced,. No murmur. No gallops or rubs detected   Abdomen: Soft, nontender, distended  Extremities: bilateral radial pulses 2+  Neuro: Alert and oriented x3.   Psych: mood and affect appropriate.    ECG: Atrial fibrillation with controlled ventricular rate    LABS:   Lab Results   Component Value Date    WBC 7.02 10/16/2020    HGB 14.9 10/16/2020    HCT 44.8 10/16/2020    PLT 292 10/16/2020     Lab Results   Component Value Date    GLU 167 (H) 10/16/2020    BUN 20.0 10/16/2020    CREAT 1.1 10/16/2020    NA 138 10/16/2020    K 4.4 10/16/2020    CL 103 10/16/2020    CO2 23 10/16/2020    CA 9.2 10/16/2020    PROT 6.9 11/17/2010    AST 21 11/17/2010    ALT 26 11/17/2010    BILITOTAL 0.6 11/17/2010     Lab  Results   Component Value Date    MG 1.6 09/20/2020    TSH 2.12 09/20/2020       IMPRESSION:   Mr. Heinze is a 79 y.o. male with the following problems:    1. 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 Coronary artery disease s/p CABG  3. Prior sinus bradycardia  4. HTN  5. Leg edema  6. Echoardiogram May 2021 with normal LV function  7. Sleep apnea, on cpap  8. Diabetes mellitus      Mr. Scalise  has recurrent persistent atrial fibrillation after cardioversion in setting of advanced age, hypertension, diabetes mellitus, coronary disease, and sleep apnea.  We discussed potential etiologies for atrial fibrillation.  Discussed variable symptoms associated with atrial fibrillation he appears to have minimal symptoms related.  We discussed need for stroke prevention which she has with Eliquis and rate control which she has been beta-blocker.  Discussed pros and cons of rhythm control.  He would like to be back in normal rhythm we discussed the obstacles in terms of limited trials of antiarrhythmics with other comorbidities and elevated body mass index medication atrial fibrillation ablation was attractive.  Limited use of Tikosyn which would require 3-day hospitalization on amiodarone understanding and need for monitoring for side effects of medication.  We will also continue rate control and anticoagulation and I would lean in this direction.  He has multiple good questions and he will think about this further.  I will be available to him depending on which route he would like to go.    RECOMMENDATIONS:    Patient to consider rate control and anticoagulation                                                Orders Placed This Encounter   Procedures    ECG 12 lead (Normal)       SIGNED:    Ida Rogue, MD         This note was generated by the Dragon speech recognition and may contain errors or omissions not intended by the user. Grammatical errors, random word insertions, deletions,  pronoun errors, and incomplete sentences are occasional consequences of this technology due to software limitations. Not all errors are caught or corrected. If there are questions or concerns about the content of this note or information contained within the body of this dictation, they should be addressed directly with the author for clarification.

## 2021-01-05 ENCOUNTER — Encounter (INDEPENDENT_AMBULATORY_CARE_PROVIDER_SITE_OTHER): Payer: Self-pay | Admitting: Cardiovascular Disease

## 2021-01-05 ENCOUNTER — Emergency Department
Admission: EM | Admit: 2021-01-05 | Discharge: 2021-01-05 | Disposition: A | Discharge status: Home / Self Care | Payer: Medicare Other | Attending: Emergency Medicine | Admitting: Emergency Medicine

## 2021-01-05 DIAGNOSIS — K921 Melena: Secondary | ICD-10-CM | POA: Insufficient documentation

## 2021-01-05 DIAGNOSIS — Z7901 Long term (current) use of anticoagulants: Secondary | ICD-10-CM

## 2021-01-05 DIAGNOSIS — K625 Hemorrhage of anus and rectum: Secondary | ICD-10-CM

## 2021-01-05 LAB — CBC AND DIFFERENTIAL
Absolute NRBC: 0 10*3/uL (ref 0.00–0.00)
Basophils Absolute Automated: 0.05 10*3/uL (ref 0.00–0.08)
Basophils Automated: 0.7 %
Eosinophils Absolute Automated: 0.11 10*3/uL (ref 0.00–0.44)
Eosinophils Automated: 1.6 %
Hematocrit: 42.3 % (ref 37.6–49.6)
Hgb: 14.6 g/dL (ref 12.5–17.1)
Immature Granulocytes Absolute: 0.02 10*3/uL (ref 0.00–0.07)
Immature Granulocytes: 0.3 %
Lymphocytes Absolute Automated: 1.88 10*3/uL (ref 0.42–3.22)
Lymphocytes Automated: 27.7 %
MCH: 30.7 pg (ref 25.1–33.5)
MCHC: 34.5 g/dL (ref 31.5–35.8)
MCV: 89.1 fL (ref 78.0–96.0)
MPV: 9 fL (ref 8.9–12.5)
Monocytes Absolute Automated: 0.55 10*3/uL (ref 0.21–0.85)
Monocytes: 8.1 %
Neutrophils Absolute: 4.18 10*3/uL (ref 1.10–6.33)
Neutrophils: 61.6 %
Nucleated RBC: 0 /100 WBC (ref 0.0–0.0)
Platelets: 261 10*3/uL (ref 142–346)
RBC: 4.75 10*6/uL (ref 4.20–5.90)
RDW: 13 % (ref 11–15)
WBC: 6.79 10*3/uL (ref 3.10–9.50)

## 2021-01-05 NOTE — Discharge Instructions (Signed)
Dear Mr. Sampedro:    Thank you for choosing one of [facility] emergency departments.  I hope your visit today was EXCELLENT.    Specific instructions for your visit today:    Hold the Eliquis x1 week.  You may begin again if there is no bleeding, but either way you should talk to Dr. Levy Sjogren or his office.  We need to obtain a repeat hemoglobin in 48hrs - Contact Dr. Marlyn Corporal office to arrange.  If they are unable to arrange for that, you should return here for a repeat hemoglobin level.      If you have lightheadedness, shortness of breath on exertion, chest pain, feelings that you are going to pass out, or have any bloody stools (maroon-colored, black, or other changes that are not blood ON normal stool), you need to return immediately.    IF YOU DO NOT CONTINUE TO IMPROVE OR YOUR CONDITION WORSENS, PLEASE CONTACT YOUR DOCTOR OR RETURN IMMEDIATELY TO THE EMERGENCY DEPARTMENT.    Sincerely,  Kaydin Karbowski, Jennette Bill, MD  Attending Emergency Physician  [facility] Emergency Department    OBTAINING A PRIMARY CARE APPOINTMENT    Primary care physicians (PCPs, also known as primary care doctors) are either internists or family medicine doctors. Both types of PCPs focus on health promotion, disease prevention, patient education and counseling, and treatment of acute and chronic medical conditions.    Call for an appointment with a primary care doctor.  Ask to see who is taking new patients.     [facility]  telephone:  [phone]  [email or web address]    For a pediatrician, call the [facility] referral line below.  You can also call to make an appointment at [facility] (except [employer] and [facility]):    [address]  [place], [place] [address]  [phone]    [facility]  Call [phone] (available 24 hours a day, 7 days a week) if you need any further referrals and we can help you find a primary care doctor or specialist.  Also, available  online at:  [email or web address]    For more information regarding our services at [facility], please call the number above or visit the website [email or web address]    YOUR CONTACT INFORMATION  Before leaving please check with registration to make sure we have an up-to-date contact number.  You can call registration at [phone], Option 7 [facility] location) or [phone], Option 1 [facility] location) to update your information.  For questions about your hospital bill, please call [phone].  For questions about your Emergency Dept Physician bill please call [phone].      FREE HEALTH SERVICES  If you need help with health or social services, please call 2-1-1 for a free referral to resources in your area.  2-1-1 is a free service connecting people with information on health insurance, free clinics, pregnancy, mental health, dental care, food assistance, housing, and substance abuse counseling.  Also, available online at:  http://www.[facility].org    MEDICAL RECORDS AND TESTS  Certain laboratory test results do not come back the same day, for example urine cultures.   We will contact you if other important findings are noted.  Radiology films are often reviewed again to ensure accuracy.  If there is any discrepancy, we will notify you.      Please call [phone] [facility] location) or [phone] ([place] location) to pick up a complimentary CD of any radiology studies performed.  If you or your doctor would like to request a copy of your medical records, please call [phone].      ORTHOPEDIC INJURY   Please know that significant injuries can exist even when an initial x-ray is read as normal or negative.  This can occur because some fractures (broken bones) are not initially visible on x-rays.  For this reason, close outpatient follow-up with your primary care doctor or bone specialist (orthopedist) is required.    MEDICATIONS AND  FOLLOWUP  Please be aware that some prescription medications can cause drowsiness.  Use caution when driving or operating machinery.    The examination and treatment you have received in our Emergency Department is provided on an emergency basis, and is not intended to be a substitute for your primary care physician.  It is important that your doctor checks you again and that you report any new or remaining problems at that time.      24 HOUR PHARMACIES  Two nearby 24 hour pharmacies are:    [facility]  [address]  [place], [place] [address]  [phone]    [facility]  [address]  [place], [place] [address]  [phone]      ASSISTANCE WITH INSURANCE    Affordable Care Act  [facility])  Call to start or finish an application, compare plans, enroll or ask a question.  [phone]  TTY: [phone]  Web:  Healthcare.gov    Help Enrolling in [facility]  [employer] [place]  564-540-6655 (TOLL-FREE)  [phone] (TTY)  Web:  Http://www.coverva.org    Local Help Enrolling in the [facility]  [place] [facility]  [phone] (MAIN)  Email:  health-help@nvfs .org  Web:  BlackjackMyths.is  Address:  [address], [address] [place], [place] [address]    SEDATING MEDICATIONS  Sedating medications include strong pain medications (e.g. narcotics), muscle relaxers, benzodiazepines (used for anxiety and as muscle relaxers), Benadryl/diphenhydramine and other antihistamines for allergic reactions/itching, and other medications.  If you are unsure if you have received a sedating medication, please ask your physician or nurse.  If you received a sedating medication: DO NOT drive a car. DO NOT operate machinery. DO NOT perform jobs where you need to be alert.  DO NOT drink alcoholic beverages while taking this medicine.     If you get dizzy, sit or lie down at the first signs. Be careful going up and down stairs.  Be extra careful to prevent falls.     Never give this medicine to others.     Keep this  medicine out of reach of children.     Do not take or save old medicines. Throw them away when outdated.     Keep all medicines in a cool, dry place. DO NOT keep them in your bathroom medicine cabinet or in a cabinet above the stove.    MEDICATION REFILLS  Please be aware that we cannot refill any prescriptions through the ER. If you need further treatment from what is provided at your ER visit, please follow up with your primary care doctor or your pain management specialist.

## 2021-01-05 NOTE — ED Provider Notes (Signed)
[facility] EMERGENCY DEPARTMENT  ATTENDING PHYSICIAN HISTORY & PHYSICAL EXAM     Patient Name: Travis Hogan, Travis Hogan  Encounter Date:  01/05/2021  Attending Emergency Physician:   Jennette Bill. Phylliss Blakes, MD Lucius Conn  Room:  5/M 5  Patient DOB:  1941/08/02  Age: 80 y.o. male  MRN:  [id number]  PCP: Conception Oms, MD         Diagnosis/Disposition:     Final Impression  Final diagnoses:   BRBPR (bright red blood per rectum)   Anticoagulated     Disposition  ED Disposition     ED Disposition Condition Date/Time Comment    Discharge  Sat Jan 05, 2021 12:10 PM Lynnda Child discharge to home/self care.    Condition at disposition: Stable          [place]  [address]  [place] [address]  [phone]    Schedule an appointment as soon as possible for a visit in 2 days      Prescriptions  New Prescriptions    No medications on file           MDM:      Initial Differential Diagnosis:   Initial differential diagnosis to include but not limited to: bleeding ulcer, variceal bleed, aortoenteric fistula, hemorrhoids, colitis (ischemic, infectious, etc), ulcerated polyps, malignancy    Plan:   Labs, discussed with cardiology, stop Eliquis, recheck CBC usual strict return precautions   80 year old male with history of hemorrhoidal bleeding recently started on Eliquis presents to the emergency department with bright red blood around stool as well as a few episodes of frank dripping.     Final Impression:       The patient was deemed stable for discharge. They were given strict return precautions as it relates to their presumed diagnosis, verbalized understanding of these precautions and agreed to follow up as instructed. All questions were answered prior to discharge.         The patient's past medical records, including those in Care Everywhere when necessary, were reviewed by me.    This patient was seen and evaluated during the SARS-CoV-2 pandemic.         History of Presenting  Illness:     Nursing Triage note: Patient reports blood in his stools for the past 45 days. Patient states today the bleeding was not stopping. +Hemorrhoids.    Chief complaint: Hemorrhoid - Rectal Bleeding    HPI  Today, I had the pleasure of helping Travis Hogan, who is a 80 y.o. male w/PMH of atrial fibrillation on Eliquis as well as recurrent hemorrhoidal bleeding who presents to the emerge department today because he is having afraid of blood from his rectum separate from stool.  There is no bleeding.  He remains with no symptoms of chest pain shortness of breath or dyspnea on exertion of blood from his rectum.  He has had known bleeding hemorrhoids.  He has had some amount of bright red blood per rectum for approximately the last month to 2 months.  It is always been spots or streaks of bright red blood on normal brown stool.  He has never had any melena.  He has never had any maroon stools.  He is not experiencing any lightheadedness dyspnea or DOE.  He comes in today because he has had an episode of red blood dripping from his rectum.  There remains with no changes in stool.  There is  no lightheadedness or chest pain there is no shortness of breath or dyspnea on exertion there is no pain in the area there is no fevers or chills.  He is noted to have recently been started on Eliquis atrial fibrillation.  Never had a stroke.  There is no fall.  He is not with any objects in his rectum      Review of Systems:  Physical Exam:     Review of Systems   All other systems reviewed and are negative.      All other systems reviewed and negative except what is specifically documented above.     Pulse 97   BP 144/72   Resp 16   SpO2 97 %   Temp 97.6 F (36.4 C)     Physical Exam  Vitals and nursing note reviewed.   Constitutional:       Appearance: He is well-developed.   HENT:      Head: Normocephalic and atraumatic.   Eyes:      Conjunctiva/sclera: Conjunctivae normal.      Pupils: Pupils are equal, round,  and reactive to light.   Cardiovascular:      Rate and Rhythm: Normal rate and regular rhythm.      Heart sounds: Normal heart sounds.   Pulmonary:      Effort: Pulmonary effort is normal. No respiratory distress.      Breath sounds: Normal breath sounds.   Abdominal:      Palpations: Abdomen is soft.      Tenderness: There is no abdominal tenderness. There is no guarding.   Musculoskeletal:         General: No tenderness. Normal range of motion.      Cervical back: Normal range of motion and neck supple.   Skin:     General: Skin is warm and dry.   Neurological:      Mental Status: He is alert and oriented to person, place, and time.      GCS: GCS eye subscore is 4. GCS verbal subscore is 5. GCS motor subscore is 6.      Comments: MAE   Psychiatric:         Behavior: Behavior normal.     There is some bright red blood in the area around his anus a hemorrhoid is visible  at the anal verge with an area of clot that is adherent.  There are no other visible lesions.        Diagnostic Results:     Laboratory Studies:    All lab values have been personally reviewed by me    Results     Procedure Component Value Units Date/Time    CBC and differential [[id number]] Collected: 01/05/21 1140    Specimen: Blood Updated: 01/05/21 1145     WBC 6.79 x10 3/uL      Hgb 14.6 g/dL      Hematocrit 40.9 %      Platelets 261 x10 3/uL      RBC 4.75 x10 6/uL      MCV 89.1 fL      MCH 30.7 pg      MCHC 34.5 g/dL      RDW 13 %      MPV 9.0 fL      Neutrophils 61.6 %      Lymphocytes Automated 27.7 %      Monocytes 8.1 %      Eosinophils Automated 1.6 %      Basophils  Automated 0.7 %      Immature Granulocytes 0.3 %      Nucleated RBC 0.0 /100 WBC      Neutrophils Absolute 4.18 x10 3/uL      Lymphocytes Absolute Automated 1.88 x10 3/uL      Monocytes Absolute Automated 0.55 x10 3/uL      Eosinophils Absolute Automated 0.11 x10 3/uL      Basophils Absolute Automated 0.05 x10 3/uL      Immature Granulocytes Absolute 0.02 x10 3/uL      Absolute  NRBC 0.00 x10 3/uL           Radiology Studies:    All images have been personally viewed by me    No orders to display           Interpretations, Clinical Decision Tools and Critical Care:     Discussed with cardiology.  In agreement that the patient is a low risk for complication of stroke if Eliqus is paused briefly.  Patient will follow up with PMD for recheck of CBC in 2 to 3 days.  Usual warnings.        Procedures:   Procedures        Orders Placed During This Visit:     Encounter Orders:  Orders Placed This Encounter   Procedures    CBC and differential       Encounter Medications:  Medications - No data to display        Allergies & Medications:     Allergies:  Heis allergic to atorvastatin.    Home Medications     Med List Status: In Progress Set By: Dena Billet, RN at 01/05/2021 11:05 AM                apixaban (ELIQUIS) 5 MG     Take 1 tablet (5 mg total) by mouth every 12 (twelve) hours     aspirin EC 81 MG EC tablet     Take 81 mg by mouth daily     Calcium-Magnesium 100-50 MG Tab     Take by mouth daily     cetirizine (ZYRTEC) 10 MG tablet     Take 10 mg by mouth daily.     ezetimibe (ZETIA) 10 MG tablet     Take 1 tablet (10 mg total) by mouth daily     fenofibrate micronized (LOFIBRA) 134 MG capsule     Take 134 mg by mouth every morning before breakfast.     losartan-hydrochlorothiazide (HYZAAR) 100-12.5 MG per tablet     Take 1 tablet by mouth every morning.     metoprolol tartrate (LOPRESSOR) 25 MG tablet     Take 0.5 tablets (12.5 mg total) by mouth 2 (two) times daily     Potassium 99 MG Tab     Take 1 tablet by mouth daily     Pyridoxine HCl (VITAMIN B-6) 100 MG tablet     Take 100 mg by mouth daily.     rosuvastatin (CRESTOR) 40 MG tablet     Take 40 mg by mouth daily        spironolactone (ALDACTONE) 25 MG tablet     Take 1 tablet (25 mg total) by mouth daily     Vitamin D3 (CHOLECALCIFEROL) 125 MCG (5000 UT) Tab tablet     Take 5,000 Units by mouth once a week Takes on Monday        vitamins/minerals Tab     Take 1  tablet by mouth daily               Past History:     Medical:   Past Medical History:   Diagnosis Date    Arrhythmia     AFib    Arthritis     RT SHOULDER, KNEES, LT TOTAL SHOULDER, BACK    Asthma without status asthmaticus     FOLLOWED BY DR DSEVAN-CONTROLLED ASTHMA    Blood transfusion without reported diagnosis 1996/05/15    OPEN HEART SX    Coronary artery disease May 15, 2010    CARDIAC STENT X 2    Diabetes mellitus type II 05-15-2006    BS 110-115 TYPE 2- no meds    Hyperlipidemia     Hypertensive disorder     Kidney stone May 15, 2009    PASSED ON OWN X 2    Skin cancer 1980'S    BASAL CELL FOREHEAD,  SHOULDER REMOVED    Sleep apnea     uses nightly CPAP        Surgical: He has a past surgical history that includes Joint replacement May 15, 2009); Cholecystectomy (1997); STEM CELL KNEE 15-May-2009); Coronary angioplasty with stent (DEC 05/15/2010); Cardiac surgery 1981-05-15, 05/15/96); and Colonoscopy 05-15-06).    Family: History reviewed. No pertinent family history.    Social: He reports that he has never smoked. He has never used smokeless tobacco. He reports that he does not drink alcohol and does not use drugs.        ATTESTATIONS     Abran Cantor, MD    Scribe Attestation:    There was no scribe involved in the care of this patient.     Documentation Notes:    Parts of this note were generated by the Epic EMR system/ Dragon speech recognition and may contain inherent errors or omissions not intended by the user. Grammatical errors, random word insertions, deletions, pronoun errors and incomplete sentences are occasional consequences of this technology due to software limitations. Not all errors are caught or corrected.    My documentation is often completed after the patient is no longer under my clinical care. In some cases, the Epic EMR may pull updated results into the above documentation which may not reflect all results or information that was available to me at the time of my medical decision making.     If  there are questions or concerns about the content of this note or information contained within the body of this dictation they should be addressed directly with the author for clarification.                  Ruffin Pyo, MD  01/08/21 [phone]

## 2021-01-07 NOTE — Progress Notes (Signed)
SWP and discussed his MyChart message. Pt went to [place] ER two days ago.  ER reached out to [facility].  Per their instructions, pt held eliquis and to hold x 1 week and  get CBC within 48 hours of ER visit (I.e. by today).  Pt has chronic hemorrhoids.  Pt still with recurrent blood stools after the ER visit, but not as bad currently since he is able to stop the bleeding.  Pt will see his GI doctor Dr. Dyanne Carrel on 01/15/2021.  Will discuss with Dr. Levy Sjogren regarding Eliquis.

## 2021-01-07 NOTE — Progress Notes (Signed)
Left detailed VM on pt's cell and updated pt on MyChart: Per Dr. Levy Sjogren, pt to temporarily stop Eliquis until he is seen by GI on 2/1.  This will re-visited after GI appointment.

## 2021-02-28 ENCOUNTER — Encounter (INDEPENDENT_AMBULATORY_CARE_PROVIDER_SITE_OTHER): Payer: Self-pay | Admitting: Cardiovascular Disease

## 2021-02-28 ENCOUNTER — Ambulatory Visit (INDEPENDENT_AMBULATORY_CARE_PROVIDER_SITE_OTHER): Payer: Medicare Other | Admitting: Cardiovascular Disease

## 2021-02-28 DIAGNOSIS — I4819 Other persistent atrial fibrillation: Secondary | ICD-10-CM

## 2021-02-28 NOTE — Progress Notes (Signed)
[facility] CARDIOLOGY OFFICE PROGRESS NOTE    [facility]RT [facility] OFFICE      [facility] OFFICE -CARDIOLOGY  [address]  [place] [address]  Dept: [phone]  Dept Fax: [phone]       Patient Name: Travis Hogan    Date of Visit:  February 28, 2021  Date of Birth: 1941-04-06  AGE: 80 y.o.  Medical Record #: [id number]  Requesting Physician: Travis Oms, MD      CHIEF COMPLAINT: No chief complaint on file.      HISTORY OF PRESENT ILLNESS:    Travis Hogan returns the office today for follow-up.  He had a recurrence of atrial fibrillation October 2021.  At that point time he seemed to be symptomatic.  He was set up for cardioversion and this was successfully performed on 10/26/2020, however shortly thereafter he realized he was probably back out of rhythm.  Seen in the office on December 2 where an EKG confirmed recurrence of atrial fibrillation although rate controlled.  At that point time was unclear if he was truly symptomatic, and he was set up with electrophysiology.  He saw Dr. Ida Hogan on 11/29/2020 and he appeared to have rate control, while still in atrial fibrillation.  He cannot really identify the risk of asymptomatic and was discussed that maintain normal rhythm was likely going to require hospitalization for Tikosyn, or amiodarone.  Obviously this would also require another cardioversion.  Decided to see how things went before deciding.    It seems like he has remained in atrial fibrillation since then as he is in A. fib by exam today.  His blood pressure monitor also register is him as a regular every time he checks his blood pressure.  That said, his heart rate is not high.  He says his heart rate is still measure between 60 and 80 on the monitor in the corroborates his exam findings today.  He is really hard pressed to say how much differently he feels now.  He felt when he went back in atrial fibrillation November, now these been in it, he is really not clear  that he feels any better or worse.  We talked through this and really cannot conclude that he was symptomatically affected by this.        PAST MEDICAL HISTORY: He has a past medical history of Arrhythmia, Arthritis, Asthma without status asthmaticus, Blood transfusion without reported diagnosis (1997), Coronary artery disease (2011), Diabetes mellitus type II (2007), Hyperlipidemia, Hypertensive disorder, Kidney stone (2010), Skin cancer (1980'S), and Sleep apnea. He has a past surgical history that includes Joint replacement (2010); Cholecystectomy (1997); STEM CELL KNEE (2010); Coronary angioplasty with stent (DEC 2011); Cardiac surgery (1982, 1997); and Colonoscopy (2007).    ALLERGIES:   Allergies   Allergen Reactions    Atorvastatin Other (See Comments)     Muscle Pain       MEDICATIONS:   Current Outpatient Medications:     apixaban (ELIQUIS) 5 MG, Take 1 tablet (5 mg total) by mouth every 12 (twelve) hours, Disp: 180 tablet, Rfl: 3    aspirin EC 81 MG EC tablet, Take 81 mg by mouth daily, Disp: , Rfl:     Calcium-Magnesium 100-50 MG Tab, Take by mouth daily, Disp: , Rfl:     cetirizine (ZYRTEC) 10 MG tablet, Take 10 mg by mouth daily., Disp: , Rfl:     ezetimibe (ZETIA) 10 MG tablet, Take 1 tablet (10 mg total) by  mouth daily, Disp: 90 tablet, Rfl: 3    fenofibrate micronized (LOFIBRA) 134 MG capsule, Take 134 mg by mouth every morning before breakfast., Disp: , Rfl:     losartan-hydrochlorothiazide (HYZAAR) 100-12.5 MG per tablet, Take 1 tablet by mouth every morning., Disp: , Rfl:     metoprolol tartrate (LOPRESSOR) 25 MG tablet, Take 0.5 tablets (12.5 mg total) by mouth 2 (two) times daily, Disp: 180 tablet, Rfl: 1    Potassium 99 MG Tab, Take 1 tablet by mouth daily, Disp: , Rfl:     Pyridoxine HCl (VITAMIN B-6) 100 MG tablet, Take 100 mg by mouth daily., Disp: , Rfl:     rosuvastatin (CRESTOR) 40 MG tablet, Take 40 mg by mouth daily  , Disp: , Rfl:     spironolactone (ALDACTONE) 25 MG  tablet, Take 1 tablet (25 mg total) by mouth daily, Disp: 90 tablet, Rfl: 3    Vitamin D3 (CHOLECALCIFEROL) 125 MCG (5000 UT) Tab tablet, Take 5,000 Units by mouth once a week Takes on Monday , Disp: , Rfl:     vitamins/minerals Tab, Take 1 tablet by mouth daily, Disp: , Rfl:      FAMILY HISTORY: family history is not on file.    SOCIAL HISTORY: He reports that he has never smoked. He has never used smokeless tobacco. He reports that he does not drink alcohol and does not use drugs.    PHYSICAL EXAMINATION    There were no vitals taken for this visit.    General Appearance:  A well-appearing male in no acute distress.    Skin: Warm and dry to touch   Head: Normocephalic  Eyes: EOMS Intact   ENT: masked  Neck: JVP normal, no carotid bruit  Chest: Clear to auscultation bilaterally    Cardiovascular: Irregularly irregular, normal S1-S2, soft holosystolic murmur  Abdomen: No guarding  Extremities: Warm without pitting edema.   Neuro: Alert and oriented x3. No gross motor or sensory deficits noted, affect appropriate.          LABS:   Lab Results   Component Value Date    WBC 6.79 01/05/2021    HGB 14.6 01/05/2021    HCT 42.3 01/05/2021    PLT 261 01/05/2021     Lab Results   Component Value Date    GLU 167 (H) 10/16/2020    BUN 20.0 10/16/2020    CREAT 1.1 10/16/2020    NA 138 10/16/2020    K 4.4 10/16/2020    CL 103 10/16/2020    CO2 23 10/16/2020    AST 21 11/17/2010    ALT 26 11/17/2010     Lab Results   Component Value Date    MG 1.6 09/20/2020    TSH 2.12 09/20/2020     No results found for: CHOL, TRIG, HDL, LDL        IMPRESSION:  [name] a 80 y.o.malewith the following problems:     Persistent atrial fibrillation, new diagnosis as of 09/18/2020, rate controlled, and back in AF after Nov 2021 DCCV  ?  CHADSVASc at least 5 for age, htn, dm, cad    ? Unclear duration, but seems like it have begun at the beginning of October.  ? Successful cardioversion however ~21 by early recurrence of atrial  fibrillation.  ? Remains in atrial fibrillation at this time, although not clearly symptomatic,   TTE  Nov 2021 EF, trace to mild AI, mildly dilated left atrium by volume index.   Coronary artery  disease.  ? 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  ? Subsequent NSTEMI in 2012, status post stenting to his native circumflex with residual total occlusions of two vein grafts that were too small to intervene on.  ? 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 Has been medically managed since then.  ? 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   Intolerance to Imdur (headache) and Ranexa (dizziness).   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   Hypertension,controlled   Echocardiogram May 2021: EF 55 to 60%, grade 1 diastolic dysfunction.   Pedal edema, worse recently, suspect this may have been influenced little bit by the increase in his amlodipine dose.   Hypertensive heart disease.   Obstructive sleep apnea, on CPAP.   Hyperlipidemia, on recently increased dose of Crestor. Last LDL July was around target at 75 but he is now running a little bit about that consistently, LDL now back up to 84.Marland Kitchen HDL remains low chronically.   Diabetes mellitus.   Obesity, somewhat loosely back on the Ideal Protein diet, has lost about 7 pounds since last visit. He is now supplementing with IP foods but he is not ketotic.   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   History of reactive airway disease.   Participation in [place] trial, now concluded.   S/pbilateral cataract surgery.     RECOMMENDATIONS:     Continue Eliquis 5 mg p.o. twice daily and other cardiac medicines   Long discussion today about benefits of proceeding with rhythm control versus rate control.  He is a bit wary of going through  additional procedures and long-term antiarrhythmic therapy since he is not clearly feeling better.  There is obviously some bias towards maintaining rhythm control based on the [place] AF trial.  However it is pretty clear he would need antiarrhythmic therapy for this, and he is now really of the mindset he he is not feeling atrial fibrillation.   He is adequate rate control even in atrial fibrillation.  Going to leave things as they are for now I will follow up with him this summer.  Were still within the window of benefit and feasibility for attempting cardioversion with antiarrhythmic drug therapy.  Getting up past year or so, likely the yield and success rate would start to drop off.   Should he become more symptomatic aware of this as the weather warms and he does his usual outdoor activities, he is free to call me in relayed to me if he changes mind and want to proceed along these lines sooner than the next visit.  As it is now seen in July.                                                   No orders of the defined types were placed in this encounter.      No orders of the defined types were placed in this encounter.      SIGNED:    Lelan Pons, MD          This note was generated by the Dragon speech recognition and may contain errors or omissions not intended by  the user. Grammatical errors, random word insertions, deletions, pronoun errors, and incomplete sentences are occasional consequences of this technology due to software limitations. Not all errors are caught or corrected. If there are questions or concerns about the content of this note or information contained within the body of this dictation, they should be addressed directly with the author for clarification.

## 2021-03-01 ENCOUNTER — Encounter (INDEPENDENT_AMBULATORY_CARE_PROVIDER_SITE_OTHER): Payer: Self-pay

## 2021-03-19 NOTE — Progress Notes (Signed)
Travis Hogan, Travis Hogan    Date of Visit:  04/06/2018  Date of Birth: [birth year]  Age: 80 yrs.   Medical Record Number: [id number]  __  CURRENT DIAGNOSES     1. Sleep Apnea, Obstructive, 327.23   2. 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 Pure hypercholesterolemia, unspecified, E78.00  4. Hypertension (essential or benign or malignant), I10  5. 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 Cardiac murmur, unspecified, R01.1  8. Chest pain, unspecified, R07.9  9. Abnormal electrocardiogram [ECG]  [EKG], R94.31  10. 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 Pre-operative cardiovascular exam, V72.81  13. Cardiac murmur, unspecified, R01.1  14. Abnormal Test-Abnormal  Lexiscan Stress Nuclear Study, 794.30  15. DM Type II, Uncomplicated,Unspecified, 250.00  16. Obesity, unspecified, 278.00  __  ALLERGIES     Atorvastatin, Muscle pains  __  MEDICATIONS     1. Acidophilus capsule, Ideal Protein 1 po qd   2. amlodipine 5 mg tablet, 1 tablet PO daily  3. Aspirin Low Dose 81 mg tablet,delayed release, 1 po qam  4. Cpap -  5. Crestor 40 mg tablet, 1 po qd  6. fenofibrate micronized 134 mg capsule, 1 po qpm  7. Fish Oil Concentrate 1,000  mg capsule, EPA 1 po bid  8. folic acid 1 mg tablet, 2 po qam  9. Ideal Protein Calcium/magnesium 150mg /75mg  tablet, Take as Directed  10. Ideal Protein Calcium/magnesium 150mg /75mg  tablet, Take as Directed  11. Ideal Protein Multivitamin  tablet, Take as Directed  12. Ideal Protein Potassium Citrate 99 mg tablet, Take as Directed  13. losartan 100 mg-hydrochlorothiazide 12.5 mg tablet, 1 po qam  14. Vitamin B-6 100 mg tablet, 1 po qam  15. Vitamin D3 5,000 unit tablet,  1 po qd  16. Zyrtec 10  mg tablet, prn  __  CHIEF COMPLAINT/REASON FOR VISIT  Followup of Hypertension (essential or benign or malignant), Followup  of Obesity, unspecified, Followup of Sleep Apnea and Obstructive  __  HISTORY OF PRESENT ILLNESS  Travis Hogan is a 80 year old male who returns  to the office today for follow-up. He has been doing well since his last visit. He stopped exercising last month due to his cataract removal surgery. He had a left eye cataract surgery in March and a right eye cataract surgery on April 02, 2018. His leg  swelling symptom has been well-controlled. His blood pressure today in the office is 130-134/70-76, slightly elevated. He reports that he had a swelling in cornea after his first cataract surgery. His blood pressure retaken in the office today is 120/62  after he has been sitting for a few minutes.    __   PAST HISTORY     Past Medical Illnesses: Asthma 1995, Diabetes mellitus-adult onset, Hyperlipidemia,  Hypertension, OSA/CPAP,obesity, osteoarthritis;  Past Cardiac Illnesses: Coronary Artery Disease, hypertensive  heart disease, S/P Myocardial infarction-non Q wave December 2011, mild MR; Infectious Diseases: No previous history of significant infectious diseases.;  Surgical Procedures: Coronary Artery Bypass Surgery 1982, with 4 grafts, Redo CABG 1997, with 3 grafts, Cholecystectomy 1997, Kidney stone, shoulder surgery, left-03/2009, 11/29/10 l knee stem cell injection,  R and L cataracts 2019; Trauma History: No previous history of significant trauma.; Cardiology Procedures-Invasive :  Cardiac Cath (left) June 1982, Cardiac Cath (left) September 1997, Cardiac Cath intracoronary stent December 2011; Cardiology Procedures-Noninvasive:  Echocardiogram August 2009, Adenosine Dual Isotope August 2009, Naughton treadmill stress test March 2012, MPI Dual Isotope Exercise May 2013, Echocardiogram May 2013, MPI 02/15/14, Echocardiogram March 2015;  Cardiac Cath Results: 03/02/2014 Cardiac Cath: Patent Left IMA  to LAD, patent previously stented spah vein graft to rt posterolateral, patent previous stents to native circ., new disease to ostial proximal  rt PDA; Left Ventricular Ejection Fraction: LVEF of 65% documented via echocardiogram on 02/20/2014   ___  FAMILY HISTORY  Brother -- Coronary artery bypass grafting  Father --  Coronary artery bypass grafting    __  CARDIAC RISK FACTORS     Tobacco Abuse: has never used tobacco;  Family History of Heart Disease: positive; Hyperlipidemia: positive;  Hypertension: positive;  Diabetes Mellitus: positive, Adult onset;  Prior History of Heart Disease: positive; Obesity: positive, BMI30 (Obesity), BMI 46 (02/2011);  Sedentary Life Style:negative; [id number]; Menopausal :not applicable  __  SOCIAL HISTORY     Alcohol Use: Does not use alcohol; Smoking: Does not smoke; Never smoker ([id number]);  Diet: Regular diet, caffeine use and no frequency noted; Exercise: Exercises regularly three times  a week; 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;   __  REVIEW OF SYSTEMS     General: Positive for feels well, no change in exercise tolerance., Positive for weight gain; Integumentary : Denies any change in hair or nails, rashes, or skin lesions.; Eyes: Positive for wears eye glasses/contact lenses;  Ears, Nose, Throat, Mouth: Denies any hearing loss, epistaxis, hoarseness or difficulty speaking.;Respiratory : Positive for sleep apnea, on CPAP; Cardiovascular: Denies palpitations, chest pain, orthopnea, PND, peripheral edema, syncope or claudication.;  Abdominal : Denies ulcer disease, hematochezia or melena.;Musculoskeletal:Positive for arthritis;  Neurological : Denies any history of recurrent strokes, TIA, or seizure disorder.; Psychiatric: Denies  any history of depression, substance abuse or change in cognitive functions.; Endocrine: Hyperlipidemia, non-insulin dependent diabetes mellitus;  Hematologic/Immunologic: Positive for seasonal allergies  __  PHYSICAL EXAMINATION    Vital  Signs:  Blood  Pressure:  130/76 Sitting, Left arm, regular cuff  134/70 Sitting, Right arm, regular cuff     Weight: 243.60 lbs.  Height: 66.00"  BMI:  39   Pulse: 60/min. Apical Regular       Constitutional:  Cooperative, alert and oriented,well developed, well nourished, in no acute distress., severely obese Skin: warm and dry to touch  Head: normocephalic Neck: carotid pulses are full and equal bilaterally, no JVD, no bruits  Chest: clear to auscultation bilaterally, no use of accessory muscles, normal respiratory effort Cardiac : soft S1 S2, soft systolic murmur evident, No S3 or S4, Regular rhythm, unable to palpate PMI due to body habitus Abdomen: abdomen soft  Peripheral Pulses: 2+ radial pulses b/l Extremities/Back: trace ankle/pedal edema bilaterally  Psychiatric: normal memory Neurological: affect appropriate   __  IMPRESSIONS:   1. Coronary  artery disease, status post four-vessel CABG in 1982 with a redo in 1997.   Subsequent NSTEMI in 2012, status post stenting to his native circumflex with residual   total occlusions of two vein grafts that were too small to intervene on. Subsequent  abnormal   nuclear stress test in 2015 and repeat left heart catheterization showing new lesion in   the RPDA that was not amenable to intervention. Has been medically managed since then.  2. Intolerance to Imdur (headache) and Ranexa (dizziness).   3. History of bradycardia, which has  limited use of beta blocker.  4. Hypertension, improved with addition of daily amlodipine.  5. Hypertensive heart disease.  6. Obstructive sleep apnea, on CPAP.  7. Hyperlipidemia, on recently increased  dose of Crestor.  8. Diabetes mellitus.  9. Obesity, has been on and off Ideal Protein Diet.  10. 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  11. History of reactive airway disease.  12. Participation in ACCELERATE trial, now concluded.  13.  Recent bilateral cataract surgery.     RECOMMENDATIONS:  1. Ask Mr. Lanz to have his cholesterol checked sometime in the  next few weeks.   Will adjust medicine as needed.   2. Otherwise plan to see him back for usual checkup in 6  months, sooner if needed  3. Encouraged him to increase his activities as he has consistently been 10 t o12 pounds   heavier than he had been over the period time from 2015 to 2017.    Documented by Ivy Lynn, acting as a scribe for Dr.  Bruce Donath, MD, [facility].    I, Dr. Bruce Donath, personally performed the services described in the documentation, as scribed by Ivy Lynn in my presence, and it is both accurate and correct.    Bruce Donath, MD, [facility]    cc: Dala Dock  MD  ____________________________  TODAYS ORDERS  Lipid Panel 1 week  Return Visit 15 MIN 6 months

## 2021-03-19 NOTE — Progress Notes (Signed)
Travis Hogan   [address] [place], [place] [address]           Travis Hogan    Date of Visit:  11/07/2019  Date of Birth: [birth year]  Age: 80 yrs.   Medical Record Number: [id number]  __  CURRENT DIAGNOSES     1. Sleep Apnea, Obstructive, 327.23   2. 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 Pure hypercholesterolemia, unspecified, E78.00  4. Hypertension (essential or benign or malignant), I10  5. 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 Cardiac murmur, unspecified, R01.1  8. Chest pain, unspecified, R07.9  9. Abnormal electrocardiogram [ECG]  [EKG], R94.31  10. 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 Pre-operative cardiovascular exam, V72.81  13. Cardiac murmur, unspecified, R01.1  14. Abnormal Test-Abnormal  Lexiscan Stress Nuclear Study, 794.30  15. DM Type II, Uncomplicated,Unspecified, 250.00  16. Obesity, unspecified, 278.00  __  ALLERGIES     Atorvastatin, Muscle pains  __  MEDICATIONS     1. Acidophilus capsule, Ideal Protein 1 po qd   2. amlodipine 5 mg tablet, 1 tablet PO daily  3. Aspirin Low Dose 81 mg tablet,delayed release, 1 po qam  4. Cpap -  5. Crestor 40 mg tablet, 1 po qd  6. fenofibrate micronized 134 mg capsule, 1 po qpm  7. Fish Oil Concentrate 1,000  mg capsule, EPA 1 po bid  8. folic acid 1 mg tablet, 2 po qam  9. Ideal Protein Calcium/magnesium 150mg /75mg  tablet, Take as Directed  10. Ideal Protein Calcium/magnesium 150mg /75mg  tablet, Take as Directed  11. Ideal Protein Multivitamin  tablet, Take as Directed  12. Ideal Protein Potassium Citrate 99 mg tablet, Take as Directed  13. losartan 100 mg-hydrochlorothiazide 12.5 mg tablet, 1 po qam  14. Vitamin B-6 100 mg tablet, 1 po qam  15. Vitamin D3 5,000 unit tablet,  1 po qd  16.  Zyrtec 10 mg tablet, prn    __  HISTORY OF PRESENT ILLNESS    Travis Hogan [facility] to the [facility] today for follow-up. He declined  a telehealth visit during the spring but says he is actually been feeling well. 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 He actually offered to send me a copy of his annual Christmas card  which he usually derives from [employer] of winter photos he is taken up in the mountains of [place] during the past winters.    He is remaining active during the pandemic. He is gone back to a gym which uses social distancing and has no more  than 20 participants at a time spread out over a large area. Denies any chest pain or shortness of breath, but he is put on some weight since last time I saw him. He attributes this to some changes in his diet that he can work on. He reports no chest  pain, and has no edema. He said he generally feels well. Other than going to the gym, has been very restricted during the pandemic.      __  PAST HISTORY      Past Medical Illnesses: Asthma 1995, Diabetes mellitus-adult onset, Hyperlipidemia, Hypertension, OSA/CPAP,obesity, osteoarthritis;   Past Cardiac Illnesses: Coronary Artery Disease, hypertensive heart disease, S/P Myocardial infarction-non  Q wave December 2011, mild MR; Infectious Diseases : No previous history of significant infectious diseases.; Surgical Procedures: Coronary Artery Bypass Surgery 1982, with 4 grafts, Redo CABG 1997, with  3 grafts, Cholecystectomy 1997, Kidney stone, shoulder surgery, left-03/2009, 11/29/10 l knee stem cell injection, R and L cataracts 2019; Trauma History : No previous history of significant trauma.; Cardiology Procedures-Invasive: Cardiac Cath (left) June 1982, Cardiac Cath (left) September 1997, Cardiac  Cath intracoronary stent December 2011; Cardiology Procedures-Noninvasive: Echocardiogram August 2009, Adenosine Dual Isotope August 2009, Naughton treadmill   stress test March 2012, MPI Dual Isotope Exercise May 2013, Echocardiogram May 2013, MPI 02/15/14, Echocardiogram March 2015; Cardiac Cath Results: 03/02/2014  Cardiac Cath: Patent Left IMA to LAD, patent previously stented spah vein graft to rt posterolateral, patent previous stents to native circ., new disease to ostial proximal rt PDA; Left Ventricular Ejection  Fraction: LVEF of 65% documented via echocardiogram on 02/20/2014  ___  FAMILY HISTORY   Brother -- Coronary artery bypass grafting  Father -- Coronary artery bypass grafting     SOCIAL HISTORY    Alcohol Use: Does not use alcohol; Smoking : Does not smoke; Never smoker ([id number]); Diet: Regular diet, caffeine use and no frequency noted;  Exercise: Exercises regularly three times a week; 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;   __   REVIEW OF SYSTEMS    General: Positive for feels well, no change in exercise tolerance., Positive  for weight gain; Integumentary: Denies any change in hair or nails, rashes, or skin lesions.; Eyes : Positive for wears eye glasses/contact lenses; Ears, Nose, Throat, Mouth: Denies any hearing loss, epistaxis, hoarseness or difficulty speaking.; Respiratory: Positive for sleep apnea, on CPAP; Cardiovascular: Denies palpitations, chest pain, orthopnea,  PND, peripheral edema, syncope or claudication.; Abdominal : Denies ulcer disease, hematochezia or melena.; Musculoskeletal:Positive for arthritis; Neurological : Denies any history of recurrent strokes, TIA,  or seizure disorder.; Psychiatric: Denies any history of depression, substance abuse or change in cognitive functions.;  Endocrine: Hyperlipidemia, non-insulin dependent diabetes mellitus; Hematologic/Immunologic: Positive  for seasonal allergies  __  PHYSICAL EXAMINATION    Vital Signs:  Blood Pressure:   140/70 Sitting, Right arm, large cuff  140/80 Sitting, Left arm, large cuff    Weight: 246.40 lbs.   Height: 66.00"  BMI: 39.77   Pulse:  64/min. Apical Regular        Constitutional: Cooperative, alert and oriented,well developed, well nourished, in no acute distress., severely  obese Skin: warm and dry to touch Head: normocephalic  Neck: carotid pulses are full and equal bilaterally, no JVD, no bruits Chest: clear to auscultation  bilaterally, no use of accessory muscles, normal respiratory effort Cardiac: soft S1 S2, soft systolic murmur evident, No S3 or S4, Regular rhythm, unable  to palpate PMI due to body habitus Abdomen: abdomen soft Peripheral Pulses : 2+ radial pulses b/l Extremities/Back: trace ankle/pedal edema bilaterally Psychiatric : normal memory Neurological: affect appropriate   __    Medications added today by the physician:    IMPRESSIONS:   1. Coronary  artery disease, status post four-vessel CABG in 1982 with a redo in 1997.   Subsequent NSTEMI in 2012, status post stenting to his native circumflex with residual   total occlusions of two vein grafts that were too small to intervene on. Subsequent  abnormal   nuclear stress test in 2015 and repeat left heart catheterization showing new lesion in   the RPDA that was not amenable to intervention. Has been medically managed since then.  2. Intolerance to Imdur (headache) and Ranexa (dizziness).   3. 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  4. Hypertension, improved with addition of daily amlodipine. Blood pressure currently controlled.  5. Hypertensive heart disease.  6. Obstructive sleep apnea, on CPAP.   7. Hyperlipidemia, on recently increased dose of Crestor. Last LDL July was around target at 75. HDL remains low chronically.  8. Diabetes mellitus.  9. Obesity, somewhat loosely back on the Ideal Protein diet, has lost about 7 pounds since last  visit. He is now supplementing with IP foods but he is not ketotic.  10. 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  11. History of reactive airway disease.  12. Participation in ACCELERATE trial, now concluded.  13. Recent bilateral cataract surgery.       PLAN:  1. Travis Hogan is overall doing well. He knows he is a little bit better job of calorie management and carbohydrate intake but is remain abnormally active during the pandemic.  2. I left him on the same medications for now. We will check lipids  again in January which is 6 months from his last check. If he is not at target, would add Zetia 10 mg daily to achieve a stable level below 70 mg/dl.  3. Have ordered a echocardiogram for 6 months from now I will plan to see him in the [facility] at that  time. Plan is to check LV function. I have held off on repeat ischemic evaluation of his last ischemic evaluation 2015 led to catheterization did not yield an intervention, is not having any specific symptoms.  4. I did give him my address as he was  planning on sending me his Christmas card at home. This is very much appreciate I love talking about photography with him, given his extensive history in photography, particularly video, which is not something I practice much myself.    This note  was generated by the [facility] EMR system/Dragon speech recognition and may contain errors or omissions not intended by the user. Grammatical errors, random word insertions, deletions, pronoun errors, and incomplete sentences are occasional consequences of  this technology due to software limitations. Not all errors are caught or corrected. If there are questions or concerns about the content of this note or information contained within the body of this dictation, they should be addressed directly with the  author for clarification.      Travis Donath, MD, [facility]      cc: Dala Dock MD    ____________________________  TODAYS ORDERS  2D, color flow, doppler echocardiogram 6 months  Diet_mgmt_edu,_guidance_and_counseling  TODAY  Lipid Panel 2 months  12 Lead ECG Today  Return Visit 15 MIN 6 months

## 2021-03-19 NOTE — Progress Notes (Signed)
[facility] OFFICE  [address] [place], [place] [address]     Travis Hogan    Date of Visit:  10/15/2017  Date of Birth: Sep 08, 1941  Age: 80 yrs.   Medical Record Number: [id number]  __  CURRENT DIAGNOSES     1. 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 Cardiac murmur, unspecified, R01.1  4. Chest pain, unspecified, R07.9   5. Abnormal electrocardiogram [ECG] [EKG], R94.31  6. 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 Pure hypercholesterolemia, unspecified, E78.00  9. Pre-operative cardiovascular exam,  V72.81  10. Cardiac murmur, unspecified, R01.1  11. DM Type II, Uncomplicated,Unspecified, 250.00  12. Obesity, unspecified, 278.00  13. Sleep Apnea, Obstructive, 327.23  14. 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 Abnormal Test-Abnormal Lexiscan Stress Nuclear Study, 794.30  __  ALLERGIES    Atorvastatin,  Muscle pains  __  MEDICATIONS     1. Cpap -  2. Crestor 20 mg Tablet, 1 po qd  3. fenofibrate  micronized 134 mg capsule, 1 po qpm  4. losartan 100 mg-hydrochlorothiazide 12.5 mg tablet, 1 po qam  5. Aspirin Low Dose 81 mg tablet,delayed release, 1 po qam  6. Fish Oil Concentrate 1,000 mg capsule, EPA 1 po bid  7. folic acid 1 mg  tablet, 2 po qam  8. Vitamin B-6 100 mg tablet, 1 po qam  9. potassium 99 mg tablet, Ideal protein, 1 po qam  10. Acidophilus capsule, Ideal Protein 1 po qd  11. Zyrtec 10 mg tablet, prn  12. Multiple Vitamin-Minerals tablet, Ideal  protein, take as directed  13. Calcium 500 500 mg calcium (1,250 mg) tablet, 1 po qd  14. Vitamin D3 5,000 unit tablet, 1 po qd  15. amlodipine 5 mg tablet, 1 po qd  16. Crestor 40 mg tablet, 1 po qd  __   CHIEF COMPLAINT/REASON FOR VISIT  Followup of Atherosclerotic heart disease of native coronary artery  with unspecified angina pectoris, Followup of Hypertensive Heart Disease and Benign w/o CHF  __   HISTORY OF PRESENT ILLNESS  [name] is a 80 year old male who returns to the office today for follow-up. He reports that his blood pressure has been labile at home, with only mild improvement  after the addition of prn amlodipine. He takes the amlodipine when his blood pressure is above 130, which is around every daily. His blood pressure is 158/68 in the office today. He notes mild ankle swelling.     __  PAST HISTORY     Past Medical Illnesses:  Asthma 1995, Diabetes mellitus-adult onset, Hyperlipidemia, Hypertension, OSA/CPAP,obesity, osteoarthritis;  Past Cardiac Illnesses : Coronary Artery Disease, hypertensive heart disease, S/P Myocardial infarction-non Q wave December 2011; Infectious Diseases: No previous history of  significant infectious diseases.; Surgical Procedures: Coronary Artery Bypass Surgery 1982, with 4 grafts, Redo CABG 1997, with 3 grafts, Cholecystectomy  1997, Kidney stone, shoulder surgery, left-03/2009, 11/29/10 l knee stem cell injection; Trauma History: No previous history of significant trauma.;  Cardiology Procedures-Invasive: Cardiac Cath (left) June 1982, Cardiac Cath (left) September 1997, Cardiac Cath intracoronary stent December 2011; Cardiology  Procedures-Noninvasive: Echocardiogram August 2009, Adenosine Dual Isotope August 2009, [place] treadmill stress test March 2012, MPI Dual Isotope Exercise May 2013, Echocardiogram May 2013, MPI 02/15/14,  Echocardiogram March 2015; Cardiac Cath  Results: 03/02/2014 Cardiac Cath: Patent Left IMA to LAD, patent previously stented spah vein graft to rt posterolateral,  patent previous stents to native circ., new disease to ostial proximal rt PDA; Left Ventricular Ejection Fraction: LVEF of 65% documented via echocardiogram  on 02/20/2014  ___  FAMILY HISTORY   Brother -- Coronary artery bypass grafting  Father -- Coronary artery bypass  grafting     __  CARDIAC RISK FACTORS     Tobacco Abuse: has never used tobacco;  Family History of Heart Disease: positive; Hyperlipidemia: positive;  Hypertension: positive;  Diabetes Mellitus: positive,  Adult onset; Prior History of Heart Disease: positive; Obesity : positive, BMI30 (Obesity), BMI 46 (02/2011); Sedentary Life Style:negative; Age :positive; Menopausal:not applicable  __  SOCIAL HISTORY     Alcohol Use: Does not use alcohol; Smoking: Does not smoke; Never smoker ([id number]);  Diet: Regular diet, caffeine use and no frequency noted; Exercise: Exercises regularly three times a week;  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;   __  REVIEW OF SYSTEMS     General: Positive for feels well, no change in exercise tolerance., Positive for weight gain; Integumentary : Denies any change in hair or nails, rashes, or skin lesions.; Eyes: Positive for wears eye glasses/contact  lenses; Ears, Nose, Throat, Mouth: Denies any hearing loss, epistaxis, hoarseness or difficulty speaking.; Respiratory: Positive for sleep apnea, on CPAP; Cardiovascular : Denies palpitations, chest pain, orthopnea, PND, peripheral edema, syncope or claudication.; Abdominal  : Denies ulcer disease, hematochezia or melena.;Musculoskeletal: Positive for arthritis; Neurological : Denies any history of recurrent strokes, TIA, or seizure disorder.;  Psychiatric: Denies any history of depression, substance abuse or change in cognitive functions.; Endocrine : Hyperlipidemia, non-insulin dependent diabetes mellitus; Hematologic/Immunologic:  Positive for seasonal allergies  __  PHYSICAL EXAMINATION    Vital Signs :  Blood Pressure:  156/70 Sitting, Left arm, regular cuff  158/68 Sitting, Right arm, regular cuff     Weight: 244.20 lbs.  Height: 66.00"  BMI:  39   Pulse: 60/min. Apical       Constitutional:  Cooperative, alert and oriented,well developed, well nourished, in no acute distress., severely obese Skin: warm and dry to touch  Head: normocephalic,  balding male hair pattern Eyes:  conjunctivae and lids unremarkable ENT: No pallor or cyanosis  Neck: thick neck, no obvious JVD or bruits, carotid pulses are full and equal bilaterally Chest : diminished breath sounds, although clear Cardiac: soft S1 S2, soft systolic murmur evident, No S3 or S4, Regular rhythm, unable to palpate PMI due to  body habitus Abdomen: abdomen unremarkable, abdomen soft, obese Peripheral Pulses : diminished distal pulses bilaterally, no bruits, unable to palpate femoral pulses due to obesity Extremities/Back : trace ankle/pedal edema bilaterally Psychiatric: normal memory  Neurological: No gross motor or sensory deficits noted, affect appropriate, oriented to time, person and place.   __     Medications added today by the physician:  amlodipine 5 mg tablet, 1 po qd, 90  Crestor 40 mg tablet, 1 po qd, 90    IMPRESSIONS :  1. CAD status post remote four-vessel bypass in 1982 and redo CABG in 1997.   a. Abnormal nuclear study 08/09, small inferior defect, possible   diaphragmatic attenuation artifact, normal EF.   b. Recurrent symptoms, predominant exertional  dyspnea with   non-STEMI, positive troponin, subsequent catheterization, severe   native vessel disease: Thrombotic 99% lesion in the SVG to   diagonal with small diagonal branch; SVG to OM totally occluded;   SVG  to right posterior lateral  branch 70 to 80% proximal lesion   with subsequent 3.5 x 16 mm PROMUS and a 2.5 x 16 mm stent   to the native circumflex. The "culprit" vein graft to diagonal was   felt to be too small of a vessel to intervene on.   c. Recurrent chest  pain with MPI 02/15/14 now showing inferior and   anterobasal ischemia. (new from 2013).  d. Cath 03/02/14: new lesion in RPDA, not approachable via either   native RCA or graft (Graft goes to RPL branch). Anatomy   otherwise unchanged--medical  therapy.  e. Intolerant of Imdur (headache) and Ranexa (dizziness)--no angina  currently despite being off both meds.  2.  Obesity.  3. OSA with CPAP.  4. Hypertension/HCVD, currently above target with his home readings in the morning  in the   130s. Patient has been taking amlodipine prn only.   5. Dyslipidemia, above target at 99 mg/dL and has been increasing.   6. Diabetes mellitus.   7. History of reactive airways tolerating current medications.  8. Effient GERD.   9. Participation in the Accelerate Trial, now concluded.  10. Bradycardia, asymptomatic.    RECOMMENDATIONS:  1. Begin taking amlodipine 5 mg  every day rather than prn.   2. Increase Crestor to 40 mg daily based on his LDL remaining at 99 mg/dL.   3. Monitor for worsening ankle edema.   4. Return visit in one month to reassess.   5. Increase amlodipine as needed, and can add  additional medications such as hydralazine.   Would avoid beta blockers because of his low resting heart rate.     Documented by Theresia Bough, acting as a scribe for Dr. Bruce Donath, MD, [facility].    I, Dr. Bruce Donath, personally performed  the services described in the documentation, as scribed by Theresia Bough in my presence, and it is both accurate and correct.    Bruce Donath, MD, [facility]     cc: Dala Dock MD    JLF  ____________________________  TODAYS ORDERS  Lipid Panel 3 months  Return Visit with NP/PA and [facility] only  1 month

## 2021-03-19 NOTE — Progress Notes (Signed)
[facility] OFFICE  [address] [place], [place] [address]     [name]    Date of Visit:  11/26/2017  Date of Birth: 1941/11/07  Age: 80 yrs.   Medical Record Number: [id number]  __  CURRENT DIAGNOSES     1. Sleep Apnea, Obstructive, 327.23   2. 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 Pure hypercholesterolemia, unspecified, E78.00  4. Hypertension (essential or benign or malignant), I10  5. 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 Cardiac murmur, unspecified, R01.1  8. Chest pain, unspecified, R07.9  9. Abnormal electrocardiogram [ECG]  [EKG], R94.31  10. 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 Pre-operative cardiovascular exam, V72.81  13. Cardiac murmur, unspecified, R01.1  14. Abnormal Test-Abnormal  Lexiscan Stress Nuclear Study, 794.30  15. DM Type II, Uncomplicated,Unspecified, 250.00  16. Obesity, unspecified, 278.00  __  ALLERGIES     Atorvastatin, Muscle pains  __  MEDICATIONS     1. Acidophilus capsule, Ideal Protein 1 po qd   2. amlodipine 5 mg tablet, 1 tablet PO daily  3. Aspirin Low Dose 81 mg tablet,delayed release, 1 po qam  4. Calcium 500 500 mg calcium (1,250 mg) tablet, 1 po qd  5. Cpap -  6. Crestor 40 mg tablet, 1 po qd  7. fenofibrate micronized  134 mg capsule, 1 po qpm  8. Fish Oil Concentrate 1,000 mg capsule, EPA 1 po bid  9. folic acid 1 mg tablet, 2 po qam  10. losartan 100 mg-hydrochlorothiazide 12.5 mg tablet, 1 po qam  11. Multiple Vitamin-Minerals tablet, Ideal protein,  take as directed  12. potassium 99 mg tablet, Ideal protein, 1 po qam  13. Vitamin B-6 100 mg tablet, 1 po qam  14. Vitamin D3 5,000 unit tablet, 1 po qd  15. Zyrtec 10 mg tablet, prn  __   CHIEF COMPLAINT/REASON FOR VISIT  Followup of Atherosclerotic heart  disease of native coronary artery with unspecified angina pectoris, Followup of Hypertensive Heart Disease, Benign w/o CHF, Followup  of Pure hypercholesterolemia and unspecified  __  HISTORY OF PRESENT ILLNESS  Kaide Gage is a 80 year old male who presents for followup of  hypertension. He saw Dr. Levy Sjogren six weeks ago and had a blood pressure of 156/70. He was started on amlodipine 5 mg daily. He had been taking this medication intermittently for systolic blood pressures greater than 130. With the addition of daily amlodipine,  his home blood pressure readings have been averaging 130s. Blood pressure in the office today is 124/70. He had a similar reading at his PCP's office recently.    Dr. Levy Sjogren also increased the patient's Crestor from 20 mg to 40 mg daily. His last  LDL cholesterol in October was 99. His goal LDL is less than 70 in the setting of coronary artery disease. He plans to have repeat fasting blood work in early February to evaluate his cholesterol on the higher dose of Crestor. He is tolerating this medication  without any reported side effects.    Overall, the patient feels well. He denies chest pain, palpitations, dyspnea, orthopnea, PND, dizziness, presyncope or syncope. He exercises at the gym three days per week, including 40 minutes on a stationary  bike and 20 minutes on an elliptical. He has no exertional symptoms. He continues  to follow the maintenance diet of Ideal Protein. He is trying to lose weight.  __  PAST HISTORY      Past Medical Illnesses: Asthma 1995, Diabetes mellitus-adult onset, Hyperlipidemia, Hypertension, OSA/CPAP,obesity, osteoarthritis;   Past Cardiac Illnesses: Coronary Artery Disease, hypertensive heart disease, S/P Myocardial infarction-non Q wave December 2011, mild MR; Infectious Diseases : No previous history of significant infectious diseases.; Surgical Procedures: Coronary Artery Bypass Surgery 1982, with 4 grafts, Redo CABG 1997, with  3 grafts, Cholecystectomy  1997, Kidney stone, shoulder surgery, left-03/2009, 11/29/10 l knee stem cell injection; Trauma History: No previous history of  significant trauma.; Cardiology Procedures-Invasive: Cardiac Cath (left) June 1982, Cardiac Cath (left) September 1997, Cardiac Cath intracoronary stent  December 2011; Cardiology Procedures-Noninvasive: Echocardiogram August 2009, Adenosine Dual Isotope August 2009, [place] treadmill stress test March  2012, MPI Dual Isotope Exercise May 2013, Echocardiogram May 2013, MPI 02/15/14, Echocardiogram March 2015; Cardiac Cath Results: 03/02/2014 Cardiac Cath:  Patent Left IMA to LAD, patent previously stented spah vein graft to rt posterolateral, patent previous stents to native circ., new disease to ostial proximal rt PDA; Left Ventricular Ejection Fraction : LVEF of 65% documented via echocardiogram on 02/20/2014  ___  FAMILY HISTORY   Brother -- Coronary artery bypass grafting  Father -- Coronary artery bypass grafting     __  CARDIAC RISK FACTORS     Tobacco Abuse: has never used tobacco;  Family History of Heart Disease: positive; Hyperlipidemia: positive;  Hypertension: positive;  Diabetes Mellitus: positive,  Adult onset; Prior History of Heart Disease: positive; Obesity : positive, BMI30 (Obesity), BMI 46 (02/2011); Sedentary Life Style:negative; Age :positive; Menopausal:not applicable  __  SOCIAL HISTORY     Alcohol Use: Does not use alcohol; Smoking: Does not smoke; Never smoker ([id number]);  Diet: Regular diet, caffeine use and no frequency noted; Exercise: Exercises regularly three times a week;  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  Height: 66.00"   BMI: 39   Pulse: 53/min. apical        Constitutional: Cooperative, alert and oriented,well developed, well nourished, in no acute distress., severely obese Skin:  warm and dry to  touch Head: normocephalic Neck : carotid pulses are full and equal bilaterally, no JVD, no bruits Chest: clear to auscultation bilaterally,  no use of accessory muscles, normal respiratory effort Cardiac: soft S1 S2, soft systolic murmur evident, No S3 or S4, Regular rhythm, unable to palpate  PMI due to body habitus Abdomen: abdomen soft Peripheral Pulses : 2+ radial pulses b/l Extremities/Back: trace ankle/pedal edema bilaterally  Psychiatric: normal memory Neurological:  affect appropriate   __    Medications added today by the physician:  amlodipine 5 mg tablet, 1 tablet PO daily, 90     IMPRESSIONS:   1. Coronary artery disease, status post four-vessel CABG in 1982 with a redo in 1997.   Subsequent NSTEMI in 2012, status post stenting to his native circumflex with residual total    occlusions of two vein grafts that were too small to intervene on. Subsequent abnormal nuclear   stress test in 2015 and repeat left heart catheterization showing new lesion in the RPDA that   was not amenable to intervention. Has been medically  managed since then.  2. Intolerance to Imdur (headache) and Ranexa (dizziness).  3. History of  bradycardia, which has limited use of beta blocker.  4. Hypertension, improved with addition of daily amlodipine.  5. Hypertensive heart disease.   6. Obstructive sleep apnea, on CPAP.  7. Hyperlipidemia, on recently increased dose of Crestor.  8. Diabetes mellitus.  9. Obesity, continues on Ideal Protein Diet.  10. 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  11. History of reactive airway  disease.  12. Participation in ACCELERATE trial, now concluded.    RECOMMENDATIONS:   1. Continue current cardiac medications as prescribed.   2. Repeat fasting lipid panel and liver function test in early February as already scheduled.  3. Schedule followup office visit with Dr. Levy Sjogren in four to six months, sooner as needed.  4. Continue monitoring blood pressure at home.  5. Will  continue to avoid beta blockers in  setting of resting bradycardia.  6. Continue regular aerobic exercise.  7. Routine followup with PCP.    Monalisa Bayless L. Izola Price, ANP-BC, AGACNP-BC     Tid: [id number]    cc: Dala Dock  MD    shj  ____________________________  Christianne Dolin   Return Visit 15 MIN 4-6 months with [facility]

## 2021-03-19 NOTE — Progress Notes (Signed)
[facility] OFFICE  [address] [place], [place] [address]     Travis Hogan    Date of Visit:  04/21/2016  Date of Birth: 11-May-1941  Age: 80 yrs.   Medical Record Number: [phone]  __  CURRENT DIAGNOSES     1. Pure hypercholesterolemia, unspecified,  E78.00  2. 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 Cardiac murmur, unspecified,  R01.1  5. Chest pain, unspecified, R07.9  6. Abnormal electrocardiogram [ECG] [EKG], R94.31  7. 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 Pre-operative cardiovascular exam,  V72.81  10. Cardiac murmur, unspecified, R01.1  11. DM Type II, Uncomplicated,Unspecified, 250.00  12. Obesity, unspecified, 278.00  13. Sleep Apnea, Obstructive, 327.23  14. 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 Abnormal Test-Abnormal Lexiscan Stress Nuclear Study, 794.30  __  ALLERGIES    Atorvastatin,  Muscle pains  __  MEDICATIONS     1. Cpap -  2. Crestor 20 mg Tablet, 1 po qd  3. fenofibrate  micronized 134 mg capsule, 1 po qpm  4. losartan 100 mg-hydrochlorothiazide 12.5 mg tablet, 1 po qam  5. Aspirin Low Dose 81 mg tablet,delayed release, 1 po qam  6. Fish Oil Concentrate 1,000 mg capsule, EPA 1 po bid  7. folic acid 1 mg  tablet, 2 po qam  8. Vitamin B-12 2,500 mcg sublingual tablet, 1 SL QAM  9. Vitamin B-6 100 mg tablet, 1 po qam  10. potassium 99 mg tablet, Ideal protein, 1 po qam  11. Acidophilus capsule, Ideal Protein 1 po qd  12. Zyrtec 10 mg  tablet, prn  13. Multiple Vitamin-Minerals tablet, Ideal protein, take as directed  14. Calcium 500 500 mg calcium (1,250 mg) tablet, 1 po qd  15. 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 Patient states that he has been doing well since his visit with Dr. Tery Sanfilippo in January. He has no cardiac complaints at this time, and denies chest pain, shortness of breath,  dizziness, or decreased exercise tolerance. Patient has been exercising regularly, though he has seen a maximum heart rate of 107 beats per minute on his fitness tracking watch. He does his cardio workout three times a week and remarks that he would go  more often were he not limited by arthritic pain. Patient has discontinued use of his inhaler and only uses Zyrtec during allergy season. He has experienced no palpitations, lightheadedness, syncopal episodes, orthopnea, paroxysmal nocturnal dyspnea,  or lower extremity swelling. His blood pressure today is 114/62 and he has seen a weight gain of six pounds. Patient reports that he had lipids drawn at the same time as his comprehensive metabolic panel in April, though the former is not available for  review today.    A comprehensive metabolic panel drawn on 04/11/2016 showed potassium of 4.0 mEq/L, creatinine of 1.01 mg/dL, and blood urea nitrogen of 28 mg/dL.  __  PAST HISTORY     Past Medical Illnesses:  Asthma 1995, Diabetes mellitus-adult onset, Hyperlipidemia, Hypertension, OSA/CPAP,obesity, osteoarthritis;  Past Cardiac Illnesses : Coronary Artery Disease, hypertensive heart disease, S/P Myocardial infarction-non Q wave December 2011; Infectious Diseases: No previous history of  significant infectious diseases.; Surgical Procedures: Coronary Artery Bypass Surgery 1982, with 4 grafts, Redo CABG 1997, with 3 grafts, Cholecystectomy  1997, Kidney stone, shoulder surgery,  left-03/2009, 11/29/10 l knee stem cell injection; Trauma History: No previous history of significant trauma.;  Cardiology Procedures-Invasive: Cardiac Cath (left) June 1982, Cardiac Cath (left) September 1997, Cardiac Cath intracoronary stent December 2011; Cardiology  Procedures-Noninvasive: Echocardiogram August 2009, Adenosine Dual Isotope August 2009, [place]on treadmill stress test March 2012, MPI Dual Isotope Exercise May 2013, Echocardiogram May 2013, MPI 02/15/14,  Echocardiogram March 2015; Cardiac Cath Results: 03/02/2014 Cardiac Cath: Patent Left IMA to LAD, patent previously stented spah vein graft to rt posterolateral,  patent previous stents to native circ., new disease to ostial proximal rt PDA; Left Ventricular Ejection Fraction: LVEF of 65% documented via echocardiogram  on 02/20/2014  ___  FAMILY HISTORY  Brother --  Coronary artery bypass grafting  Father -- Coronary artery bypass grafting    __  CARDIAC RISK FACTORS      Tobacco Abuse: has never used tobacco; Family History of Heart Disease: positive;  Hyperlipidemia: positive; Hypertension: positive;   Diabetes Mellitus: positive, Adult onset; Prior History of Heart Disease: positive;  Obesity: positive, BMI30 (Obesity), BMI 46 (02/2011); Sedentary Life Style:negative;  ZOX:WRUEAVWU; Menopausal:not applicable  __   SOCIAL HISTORY    Alcohol Use: Does not use alcohol;  Smoking: Does not smoke; Never smoker ([id number]); Diet: Regular diet, caffeine use and no frequency  noted; Exercise: Exercises regularly three times a week; 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;   __  REVIEW OF SYSTEMS    General : Positive for feels well, no change in exercise tolerance., Positive for weight gain; Integumentary: Denies any change in hair or nails, rashes, or  skin lesions.; Eyes: Positive for wears eye glasses/contact lenses; Ears, Nose, Throat, Mouth : Denies any hearing loss, epistaxis, hoarseness or difficulty speaking.;Respiratory: Positive for sleep apnea, on CPAP;   Cardiovascular: Denies palpitations, chest pain, orthopnea, PND, peripheral edema, syncope or claudication.; Abdominal  : Denies ulcer disease, hematochezia or melena.;Musculoskeletal:Positive for arthritis; Neurological  : Denies any history of recurrent strokes, TIA, or seizure disorder.; Psychiatric: Denies any history of depression, substance abuse or change in cognitive  functions.; Endocrine: Hyperlipidemia, non-insulin dependent diabetes mellitus; Hematologic/Immunologic : Positive for seasonal allergies  __  PHYSICAL EXAMINATION    Vital Signs:  Blood  Pressure:  110/66 Sitting, Left arm, large cuff  114/62 Sitting, Right arm, large cuff    Weight:  235.00 lbs.  Height: 66"  BMI: 38    Pulse: 53/min. Apical       Constitutional: Cooperative, alert and oriented,well developed,  well nourished, in no acute distress., severely obese Skin: warm and dry to touch Head : normocephalic, balding male hair pattern Eyes: conjunctivae and lids unremarkable ENT : No pallor or cyanosis Neck: thick neck, no obvious JVD or bruits, carotid pulses are full and equal bilaterally  Chest: diminished breath sounds, although clear Cardiac: soft S1 S2, soft systolic murmur evident,  No S3 or S4, Regular rhythm, unable to palpate PMI due to body habitus Abdomen: abdomen unremarkable, abdomen soft, obese  Peripheral Pulses: diminished distal pulses bilaterally, no bruits, unable to palpate femoral pulses due to obesity Extremities/Back : trace  ankle/pedal edema bilaterally Psychiatric: normal memory Neurological : No gross motor or sensory deficits noted, affect appropriate, oriented to time, person and place.   __    ECG: Sinus bradycardia, at 53 beats per minute.    IMPRESSIONS:  1. CAD status post remote four-vessel bypass in 1982 and  redo CABG in 1997.   a. Abnormal nuclear study 08/09, small inferior defect, possible   diaphragmatic attenuation artifact, normal EF.   b. Recurrent symptoms, predominant exertional dyspnea with    non-STEMI, positive troponin, subsequent  catheterization, severe   native vessel disease: Thrombotic 99% lesion in the SVG to   diagonal with small diagonal branch; SVG to OM totally occluded;   SVG to right posterior lateral branch 70 to 80% proximal lesion   with subsequent  3.5 x 16 mm PROMUS and a 2.5 x 16 mm stent   to the native circumflex. The "culprit" vein graft to diagonal was   felt to be too small of a vessel to intervene on.   c. Recurrent chest pain with MPI 02/15/14 now showing inferior and   anterobasal  ischemia. (new from 2013).  d. Cath 03/02/14: new lesion in RPDA, not approachable via either   native RCA or graft (Graft goes to RPL branch). Anatomy   otherwise unchanged--medical therapy.  e. Intolerant of Imdur (headache) and Ranexa  (dizziness)--no angina  currently despite being off both meds.  2. Obesity.  3. OSA with CPAP.  4. Hypertension/HCVD, currently well-controlled.   5. Dyslipidemia, awaiting results of April lipid panel.   6. Diabetes mellitus.    7. History of reactive airways tolerating current medications.  8. Effient GERD.  9. Participation in the Accelerate Trial, now concluded.  10. Bradycardia, asymptomatic.    RECOMMENDATIONS:  1. For now, since he really does not  have angina, he will remain off Imdur or Ranexa.  2. No beta blocker, given his bradycardia; however, if angina becomes an issue, could   try Norvasc.  3. Return visit with me in one year.  4. Okay to continue his exercise program as he  is doing.  5. We will obtain his most recent lipid profile from Dr. Adriana Simas and increase his Crestor as needed.      Documented by Nat Christen, acting as a scribe for Dr. Bruce Donath, MD, [facility].    I, Dr. Bruce Donath, personally performed  the services described in this documentation, as scribed by Nat Christen, in my presence, and it is both accurate and complete.    Bruce Donath, MD, [facility]    cc: Ardeen Garland III MD

## 2021-03-19 NOTE — Progress Notes (Signed)
[facility] OFFICE  [address] [place], [place] [address]     [name]    Date of Visit:  04/20/2015  Date of Birth: [birth year]  Age: 80 yrs.   Medical Record Number: [id number]  __  CURRENT DIAGNOSES     1. 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 Cardiac murmur, unspecified, R01.1  4. Chest pain, unspecified, R07.9   5. Abnormal electrocardiogram [ECG] [EKG], R94.31  6. 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 Pre-operative cardiovascular exam, V72.81  9. Cardiac murmur, unspecified, R01.1   10. DM Type II, Uncomplicated,Unspecified, 250.00  11. Hypercholesterolemia, 272.0  12. Obesity, unspecified, 278.00  13. Sleep Apnea, Obstructive, 327.23  14. 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 Abnormal Test-Abnormal  Lexiscan Stress Nuclear Study, 794.30  __  ALLERGIES    Atorvastatin, Muscle pains   __  MEDICATIONS     1. Cpap -  2. Crestor 20 mg Tablet, 1 po qd  3. fenofibrate micronized 134 mg capsule, 1 po qpm  4. losartan 100  mg-hydrochlorothiazide 12.5 mg tablet, 1 po qam  5. Aspirin Low Dose 81 mg tablet,delayed release, 1 po qam  6. Fish Oil Concentrate 1,000 mg capsule, EPA 1 po bid  7. folic acid 1 mg tablet, 2 po qam  8. Vitamin B-12 2,500 mcg sublingual  tablet, 1 SL QAM  9. Vitamin B-6 100 mg tablet, 1 po qam  10. Vitamin D3 2,000 unit capsule, 1 po qam  11. potassium 99 mg tablet, Ideal protein, 1 po qam  12. Acidophilus capsule, Ideal Protein 1 po qd  13. Zyrtec 10 mg tablet, prn   14. Multiple Vitamin-Minerals tablet, Ideal protein, take as directed  15. Ranexa 500 mg tablet,extended release, 1 po bid  16. famotidine 40 mg tablet, 1 po qd  17. 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 Mossberg is a 80 year old male with a history of coronary  artery disease status post four-vessel bypass graft in 1982 with redo grafting in 1997 and myocardial infarction treated with stenting to the circumflex in 2011. He returns to the office today for follow-up. He has overall been doing well since his previous  office visit. He continues to deny experiencing any recurrent anginal symptoms. He is able to exercise to a high level three times a week by cycling for 40 minutes at a time without any restrictions or cardiac symptoms. Mr. Dozier denies experiencing  any chest discomfort, palpitations, shortness of breath, dyspnea on exertion, lightheadedness, dizziness, syncopal episodes, orthopnea, or paroxysmal nocturnal dyspnea. He only notes occasional mild bilateral leg swelling when he is sitting down for too  long. His blood pressure is well-controlled at 120/71 in the office today.   __  PAST HISTORY      Past Medical Illnesses: Asthma 1995, Diabetes mellitus-adult onset, Hyperlipidemia, Hypertension, OSA/CPAP,obesity, osteoarthritis;  Past Cardiac Illnesses : Coronary Artery Disease, hypertensive heart disease, S/P Myocardial infarction-non Q wave December 2011; Infectious Diseases: No previous history of  significant infectious diseases.; Surgical Procedures: Coronary Artery Bypass Surgery 1982, with 4 grafts, Redo CABG 1997, with 3 grafts, Cholecystectomy  1997, Kidney stone, shoulder surgery, left-03/2009, 11/29/10 l knee stem cell injection; Trauma History: No previous history of significant trauma.;  Cardiology Procedures-Invasive: Cardiac Cath (left) June 1982, Cardiac Cath (left) September 1997, Cardiac Cath intracoronary stent December 2011;  Cardiology  Procedures-Noninvasive: Echocardiogram August 2009, Adenosine Dual Isotope August 2009, [place] treadmill stress test March 2012, MPI Dual Isotope Exercise May 2013, Echocardiogram May 2013, MPI 02/15/14,  Echocardiogram March 2015; Cardiac Cath Results: 03/02/2014 Cardiac Cath: Patent Left IMA to LAD, patent previously stented spah vein graft to rt posterolateral,  patent previous stents to native circ., new disease to ostial proximal rt PDA; Left Ventricular Ejection Fraction: LVEF of 65% documented via echocardiogram  on 02/20/2014  ___  FAMILY HISTORY  Brother --  Coronary artery bypass grafting  Father -- Coronary artery bypass grafting    __  CARDIAC RISK FACTORS      Tobacco Abuse: has never used tobacco; Family History of Heart Disease: positive;  Hyperlipidemia: positive; Hypertension: positive;   Diabetes Mellitus: positive, Adult onset; Prior History of Heart Disease: positive;  Obesity: positive, BMI30 (Obesity), BMI 46 (02/2011); Sedentary Life Style:negative;  ZOX:WRUEAVWU; Menopausal:not applicable  __   SOCIAL HISTORY    Alcohol Use: Does not use alcohol;  Smoking: Does not smoke; Never smoker ([id number]); Diet: Regular diet, caffeine use and no frequency  noted; Exercise: Exercises regularly three times a week; 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;   __  REVIEW OF SYSTEMS    General : Feels well, no change in exercise tolerance.; Integumentary: Denies any change in hair or nails, rashes, or skin lesions.;  Eyes: Wears eye glasses/contact lenses; Ears, Nose, Throat, Mouth: Denies any hearing loss, epistaxis,  hoarseness or difficulty speaking.;Respiratory: Sleep apnea, CPAP; Cardiovascular : Denies palpitations, chest pain, orthopnea, PND, peripheral edema, syncope or claudication.; Abdominal : Denies ulcer disease, hematochezia or melena.; Musculoskeletal:Arthritis; Neurological : Denies any history of recurrent strokes, TIA, or seizure  disorder.; Psychiatric: Denies any history of depression,  substance abuse or change in cognitive functions.;  Endocrine: Hyperlipidemia, non-insulin dependent diabetes mellitus; Hematologic/Immunologic: Seasonal  allergies  __  PHYSICAL EXAMINATION    Vital Signs:  Blood Pressure:   120/71 Sitting, Left arm, large cuff  120/70 Sitting, Right arm, large cuff    Weight: 227.00 lbs.   Height: 66"  BMI: 36   Pulse:  60/min.       Constitutional: Cooperative, alert and oriented,well developed, well nourished, in no acute distress., severely obese  Skin: warm and dry to touch Head: normocephalic, balding male hair pattern  Eyes: conjunctivae and lids unremarkable ENT: No pallor or cyanosis  Neck: thick neck, no obvious JVD or bruits, carotid pulses are full and equal bilaterally Chest: diminished  breath sounds, although clear Cardiac: soft S1 S2, soft systolic murmur evident, No S3 or S4, Regular rhythm, unable to palpate PMI due to body habitus  Abdomen: abdomen unremarkable, abdomen soft, obese Peripheral Pulses: diminished distal pulses bilaterally,  no bruits, unable to palpate femoral pulses due to obesity Extremities/Back: trace ankle/pedal edema bilaterally  Psychiatric: normal memory Neurological: No gross motor or sensory deficits noted, affect appropriate,  oriented to time, person and place.   __    IMPRESSIONS:  1. CAD status post remote four-vessel bypass in 1982 and redo CABG in 1997.   a. Abnormal nuclear study 08/09, small inferior defect, possible   diaphragmatic attenuation  artifact,  normal EF.   b. Recurrent symptoms, predominant exertional dyspnea with   non-STEMI, positive troponin, subsequent catheterization, severe   native vessel disease: Thrombotic 99% lesion in the SVG to   diagonal with small diagonal  branch; SVG to OM totally occluded;   SVG to right posterior lateral branch 70 to 80% proximal lesion   with subsequent 3.5 x 16 mm PROMUS and a 2.5 x 16 mm stent   to the native circumflex. The "culprit" vein graft to diagonal was   felt  to be too  small of a vessel to intervene on.   c. Recurrent chest pain with MPI 02/15/14 now showing inferior and   anterobasal ischemia. (new from 2013).  d. Cath 03/02/14: new lesion in RPDA, not approachable via either   native RCA or  graft (Graft goes to RPL branch). Anatomy   otherwise unchanged--medical therapy.  e. Intolerant of Imdur (headache) and Ranexa (dizziness)--no angina  currently despite being off both meds.  2. Obesity.  3. OSA with CPAP.  4.  Hypertension/HCVD, currently well-controlled.   5. Dyslipidemia, controlled.   6. Diabetes mellitus.   7. History of reactive airways tolerating current medications.  8. Effient GERD.  9. Participation in the Accelerate Trial, now  concluded.    RECOMMENDATIONS:  1. For now, since he really does not have angina, he will remain off Imdur or Ranexa.  2. No beta blocker, given his bradycardia; however, if angina becomes an issue, could   try Norvasc.  3. Return  visit with me in six months' time.  4. Okay to continue his exercise program as he is doing.    Documented by Ashok Norris, acting as a scribe for Dr. Bruce Donath, MD, [facility].    I, Dr. Bruce Donath, personally performed the services described  in the documentation, as scribed by Ashok Norris in my presence, and it is both accurate and correct.    Bruce Donath, MD, [facility]    cc: Ardeen Garland III MD

## 2021-03-19 NOTE — Progress Notes (Signed)
[facility] OFFICE  [address] [place], [place] [address]     Travis Hogan    Date of Visit:  05/08/2017  Date of Birth: [birth year]  Age: 80 yrs.   Medical Record Number: [id number]  __  CURRENT DIAGNOSES     1. Pure hypercholesterolemia, unspecified,  E78.00  2. 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 Cardiac murmur, unspecified,  R01.1  5. Chest pain, unspecified, R07.9  6. Abnormal electrocardiogram [ECG] [EKG], R94.31  7. 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 Pre-operative cardiovascular exam,  V72.81  10. Cardiac murmur, unspecified, R01.1  11. DM Type II, Uncomplicated,Unspecified, 250.00  12. Obesity, unspecified, 278.00  13. Sleep Apnea, Obstructive, 327.23  14. 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 Abnormal Test-Abnormal Lexiscan Stress Nuclear Study, 794.30  __  ALLERGIES    Atorvastatin,  Muscle pains  __  MEDICATIONS     1. Acidophilus capsule, Ideal Protein 1 po qd  2. Aspirin  Low Dose 81 mg tablet,delayed release, 1 po qam  3. Calcium 500 500 mg calcium (1,250 mg) tablet, 1 po qd  4. Cpap -  5. Crestor 20 mg Tablet, 1 po qd  6. fenofibrate micronized 134 mg capsule, 1 po qpm  7. Fish Oil Concentrate 1,000  mg capsule, EPA 1 po bid  8. folic acid 1 mg tablet, 2 po qam  9. losartan 100 mg-hydrochlorothiazide 12.5 mg tablet, 1 po qam  10. Multiple Vitamin-Minerals tablet, Ideal protein, take as directed  11. potassium 99 mg tablet, Ideal protein,  1 po qam  12. Vitamin B-12 2,500 mcg sublingual tablet, 1 SL QAM  13. Vitamin B-6 100 mg tablet, 1 po qam  14. Vitamin D3 5,000 unit tablet, 1 po qd  15. Zyrtec 10 mg tablet, prn  __   CHIEF COMPLAINT/REASON FOR VISIT  Followup of Atherosclerotic heart disease of native coronary artery with  unspecified angina pectoris, Followup of Hypertensive Heart Disease, Benign w/o CHF, Followup  of Presence of aortocoronary bypass graft, Followup of Pure hypercholesterolemia and unspecified  __  HISTORY OF PRESENT ILLNESS  Travis Hogan is  a 80 year old male who returns to the office today accompanied by his wife for a follow-up. Since his last visit, he has been doing well from a cardiac standing standpoint. He is trying to lose weight, but he has gained eight pounds since his last office  visit. At times, he experiences episodes of stiffness alongside aches and pains. He exercises regularly three times per week for about an hour. Typically, his blood pressure is well-controlled around 132/65, but it was measured today at 146/70. His blood  pressure was taken again at 132/72. His pulse in the mornings range around the 50s. He denies any chest pain, palpitations, lightheadedness, shortness of breath, orthopnea, syncopal episodes or paroxysmal nocturnal dyspnea. He notes that some vigorous  activities will him push toward an angina direction.    __   PAST HISTORY     Past Medical Illnesses: Asthma 1995, Diabetes mellitus-adult onset, Hyperlipidemia,  Hypertension, OSA/CPAP,obesity, osteoarthritis;  Past Cardiac Illnesses: Coronary Artery Disease, hypertensive  heart disease, S/P Myocardial infarction-non Q wave December 2011; Infectious Diseases: No previous history of significant infectious diseases.;  Surgical Procedures: Coronary Artery Bypass Surgery 1982, with 4 grafts, Redo CABG 1997, with 3  grafts, Cholecystectomy 1997, Kidney stone, shoulder surgery, left-03/2009, 11/29/10 l knee stem cell injection;  Trauma History: No previous history of significant trauma.; Cardiology Procedures-Invasive: Cardiac Cath  (left) June 1982, Cardiac Cath (left) September 1997, Cardiac Cath intracoronary stent December 2011; Cardiology Procedures-Noninvasive: Echocardiogram  August 2009, Adenosine Dual Isotope August 2009,  [place]on treadmill stress test March 2012, MPI Dual Isotope Exercise May 2013, Echocardiogram May 2013, MPI 02/15/14, Echocardiogram March 2015; Cardiac  Cath Results: 03/02/2014 Cardiac Cath: Patent Left IMA to LAD, patent previously stented spah vein graft to rt posterolateral, patent previous stents to native circ., new disease to ostial proximal rt PDA;  Left Ventricular Ejection Fraction: LVEF of 65% documented via echocardiogram on 02/20/2014  ___   FAMILY HISTORY  Brother -- Coronary artery bypass grafting  Father --  Coronary artery bypass grafting    __  CARDIAC RISK FACTORS     Tobacco Abuse: has never used tobacco;  Family History of Heart Disease: positive; Hyperlipidemia: positive;  Hypertension: positive;  Diabetes Mellitus: positive, Adult onset;  Prior History of Heart Disease: positive; Obesity: positive, BMI30 (Obesity), BMI 46 (02/2011);  Sedentary Life Style:negative; [id number]; Menopausal :not applicable  __  SOCIAL HISTORY     Alcohol Use: Does not use alcohol; Smoking: Does not smoke; Never smoker ([id number]);  Diet: Regular diet, caffeine use and no frequency noted; Exercise: Exercises regularly three times  a week; 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;   __  REVIEW OF SYSTEMS     General: Positive for feels well, no change in exercise tolerance., Positive for weight gain; Integumentary : Denies any change in hair or nails, rashes, or skin lesions.; Eyes: Positive for wears eye glasses/contact lenses;  Ears, Nose, Throat, Mouth: Denies any hearing loss, epistaxis, hoarseness or difficulty speaking.;Respiratory : Positive for sleep apnea, on CPAP; Cardiovascular: Denies palpitations, chest pain, orthopnea, PND, peripheral edema, syncope or claudication.;  Abdominal : Denies ulcer disease, hematochezia or melena.;Musculoskeletal:Positive for arthritis;  Neurological : Denies any history of recurrent strokes, TIA, or seizure disorder.; Psychiatric: Denies  any history of depression, substance abuse or  change in cognitive functions.; Endocrine: Hyperlipidemia, non-insulin dependent diabetes mellitus;  Hematologic/Immunologic: Positive for seasonal allergies  __  PHYSICAL EXAMINATION    Vital  Signs:  Blood Pressure:  142/70 Sitting, Left arm, large cuff  146/70 Sitting, Right arm, large cuff  132/72 Retaken by MD     Weight: 243.00 lbs.  Height: 66.00"  BMI:  0   Pulse: 55/min. Apical       Constitutional:  Cooperative, alert and oriented,well developed, well nourished, in no acute distress., severely obese Skin: warm and dry to touch  Head: normocephalic, balding male hair pattern Eyes: conjunctivae and lids unremarkable  ENT: No pallor or cyanosis Neck: thick neck, no obvious JVD or bruits, carotid pulses are full and  equal bilaterally Chest: diminished breath sounds, although clear Cardiac : soft S1 S2, soft systolic murmur evident, No S3 or S4, Regular rhythm, unable to palpate PMI due to body habitus Abdomen: abdomen unremarkable, abdomen  soft, obese Peripheral Pulses: diminished distal pulses bilaterally, no bruits, unable to palpate femoral pulses due to obesity  Extremities/Back: trace ankle/pedal edema bilaterally Psychiatric: normal memory  Neurological: No gross motor or sensory deficits noted, affect appropriate, oriented to time, person and place.   __    ECG: Sinus bradycardia, rate 55bpm, PR 220.    IMPRESSIONS:   1. CAD status post remote four-vessel bypass in 1982 and redo CABG in 1997.   a. Abnormal  nuclear study 08/09, small inferior defect, possible   diaphragmatic attenuation artifact, normal EF.   b. Recurrent symptoms, predominant exertional  dyspnea with   non-STEMI, positive troponin, subsequent catheterization, severe   native vessel disease: Thrombotic 99% lesion in the SVG to   diagonal with small diagonal branch; SVG to OM totally occluded;   SVG to right posterior lateral  branch 70 to 80% proximal lesion   with subsequent 3.5 x 16 mm PROMUS and a 2.5 x 16 mm stent   to the native  circumflex. The "culprit" vein graft to diagonal was   felt to be too small of a vessel to intervene on.   c. Recurrent chest  pain with MPI 02/15/14 now showing inferior and   anterobasal ischemia. (new from 2013).  d. Cath 03/02/14: new lesion in RPDA, not approachable via either   native RCA or graft (Graft goes to RPL branch). Anatomy   otherwise unchanged--medical  therapy.  e. Intolerant of Imdur (headache) and Ranexa (dizziness)--no angina  currently despite being off both meds.  2. Obesity.  3. OSA with CPAP.  4. Hypertension/HCVD, currently above target with his home readings in the morning  in the 130s   5. Dyslipidemia, awaiting results of April lipid panel. LDL 93 which has gone up slightly from 2017.  6. Diabetes mellitus.   7. History of reactive airways tolerating current medications.  8. Effient GERD.  9. Participation  in the Accelerate Trial, now concluded.  10. Bradycardia, asymptomatic.      RECOMMENDATIONS:  1. Add evening readings to his BP measurements and call the office in a month to discuss   with one of our nurses.  2. If BP consistently  above 130, add amlodipine 5mg  daily.  3. Check fasting lipids in six months. LDL is higher than ideal. Would like him to work on   some dietary adjustments as he tends to snack around 9pm.  4. He really does need to lose more weight but right  now is focusing on maintaining. I will   continue to press the issue with him at the next visit.   5. I did not feel strongly that he needs to have a follow-up and MR lipo profile.      Documented by Donata Duff, acting as a scribe  for Dr. Bruce Donath, MD, [facility].    I, Dr. Bruce Donath, personally performed the services described in the documentation, as scribed by Donata Duff in my presence, and it is both accurate and correct.    Bruce Donath, MD, [facility]     cc:   Ardeen Garland III MD   ____________________________  TODAYS ORDERS  Diet mgmt edu, guidance and counseling TODAY  ZO:XWRUEAV Education  ICD-10: I25.119  MedlinePlus Connect results for ICD-10 I25.119  Lipid Panel 6 months  12 Lead ECG Today  Return Visit 30 MIN 6 months         Bruce Donath, MD, [facility]

## 2021-03-19 NOTE — Progress Notes (Signed)
[facility] OFFICE  [address] [place], [place] [address]     [name]    Date of Visit:  10/12/2018  Date of Birth: May 27, 1941  Age: 80 yrs.   Medical Record Number: [id number]  __  CURRENT DIAGNOSES     1. Sleep Apnea, Obstructive, 327.23   2. 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 Pure hypercholesterolemia, unspecified, E78.00  4. Hypertension (essential or benign or malignant), I10  5. 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 Cardiac murmur, unspecified, R01.1  8. Chest pain, unspecified, R07.9  9. Abnormal electrocardiogram [ECG]  [EKG], R94.31  10. 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 Pre-operative cardiovascular exam, V72.81  13. Cardiac murmur, unspecified, R01.1  14. Abnormal Test-Abnormal  Lexiscan Stress Nuclear Study, 794.30  15. DM Type II, Uncomplicated,Unspecified, 250.00  16. Obesity, unspecified, 278.00  __  ALLERGIES     Atorvastatin, Muscle pains  __  MEDICATIONS     1. Cpap -  2. losartan 100 mg-hydrochlorothiazide  12.5 mg tablet, 1 po qam  3. Aspirin Low Dose 81 mg tablet,delayed release, 1 po qam  4. Fish Oil Concentrate 1,000 mg capsule, EPA 1 po bid  5. folic acid 1 mg tablet, 2 po qam  6. Vitamin B-6 100 mg tablet, 1 po qam  7. Acidophilus  capsule, Ideal Protein 1 po qd  8. Zyrtec 10 mg tablet, prn  9. Vitamin D3 5,000 unit tablet, 1 po qd  10. Crestor 40 mg tablet, 1 po qd  11. amlodipine 5 mg tablet, 1 tablet PO daily  12. fenofibrate micronized 134 mg capsule, 1 po  qpm  13. Ideal Protein Calcium/magnesium 150mg /75mg  tablet, Take as Directed  14. Ideal Protein Multivitamin tablet, Take as Directed  15. Ideal Protein Potassium Citrate 99 mg tablet, Take as Directed  16. Ideal Protein Calcium/magnesium  150mg /75mg  tablet,  Take as Directed    __  HISTORY OF PRESENT ILLNESS    Nehemias returns today for follow-up. He is overall doing well. He  is lost about 7 pounds since I last saw him. He is using to Ideal Protein meals a day, and then has a loosely low carbohydrate third meal. He is probably not cathodic on this, but he still losing weight. He denies any chest pain or shortness of breath.  His last lipid panel in the spring was excellent.    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 He was kind enough to email me some training photography he had done as well as a video he had  taken on his camera on DVD of [place].    __  PAST HISTORY     Past  Medical Illnesses: Asthma 1995, Diabetes mellitus-adult onset, Hyperlipidemia, Hypertension, OSA/CPAP,obesity, osteoarthritis;   Past Cardiac Illnesses: Coronary Artery Disease, hypertensive heart disease, S/P Myocardial infarction-non Q wave December 2011, mild MR; Infectious Diseases : No previous history of significant infectious diseases.; Surgical Procedures: Coronary Artery Bypass Surgery 1982, with 4 grafts, Redo CABG 1997, with  3 grafts, Cholecystectomy 1997, Kidney stone, shoulder surgery, left-03/2009, 11/29/10 l knee stem cell injection, R and L cataracts 2019; Trauma History : No previous history of significant trauma.; Cardiology Procedures-Invasive: Cardiac Cath (left) June 1982, Cardiac Cath (  left) September 1997, Cardiac  Cath intracoronary stent December 2011; Cardiology Procedures-Noninvasive: Echocardiogram August 2009, Adenosine Dual Isotope August 2009, [place] treadmill  stress test March 2012, MPI Dual Isotope Exercise May 2013, Echocardiogram May 2013, MPI 02/15/14, Echocardiogram March 2015; Cardiac Cath Results: 03/02/2014  Cardiac Cath: Patent Left IMA to LAD, patent previously stented spah vein graft to rt posterolateral, patent previous stents to native circ., new disease to ostial proximal  rt PDA; Left Ventricular Ejection  Fraction: LVEF of 65% documented via echocardiogram on 02/20/2014  ___  FAMILY HISTORY   Brother -- Coronary artery bypass grafting  Father -- Coronary artery bypass grafting     SOCIAL HISTORY    Alcohol Use: Does not use alcohol; Smoking : Does not smoke; Never smoker ([id number]); Diet: Regular diet, caffeine use and no frequency noted;  Exercise: Exercises regularly three times a week; 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;   __   REVIEW OF SYSTEMS    General: Positive for feels well, no change in exercise tolerance., Positive  for weight gain; Integumentary: Denies any change in hair or nails, rashes, or skin lesions.; Eyes : Positive for wears eye glasses/contact lenses; Ears, Nose, Throat, Mouth: Denies any hearing loss, epistaxis, hoarseness or difficulty speaking.; Respiratory: Positive for sleep apnea, on CPAP; Cardiovascular: Denies palpitations, chest pain, orthopnea,  PND, peripheral edema, syncope or claudication.; Abdominal : Denies ulcer disease, hematochezia or melena.; Musculoskeletal:Positive for arthritis; Neurological : Denies any history of recurrent strokes, TIA,  or seizure disorder.; Psychiatric: Denies any history of depression, substance abuse or change in cognitive functions.;  Endocrine: Hyperlipidemia, non-insulin dependent diabetes mellitus; Hematologic/Immunologic: Positive  for seasonal allergies  __  PHYSICAL EXAMINATION    Vital Signs:  Blood Pressure:   140/66 Sitting, Left arm, large cuff  150/70 Sitting, Right arm, large cuff    Weight: 239.80 lbs.   Height: 66.00"  BMI: 38.70   Pulse:  60/min. Apical Regular       Constitutional: Cooperative, alert and oriented,well developed, well nourished, in no acute distress., severely  obese Skin: warm and dry to touch Head: normocephalic  Neck: carotid pulses are full and equal bilaterally, no JVD, no bruits Chest: clear to auscultation  bilaterally, no use of accessory muscles, normal respiratory effort  Cardiac: soft S1 S2, soft systolic murmur evident, No S3 or S4, Regular rhythm, unable  to palpate PMI due to body habitus Abdomen: abdomen soft Peripheral Pulses : 2+ radial pulses b/l Extremities/Back: trace ankle/pedal edema bilaterally Psychiatric : normal memory Neurological: affect appropriate   __    Medications added today by the physician:      IMPRESSIONS:   1. Coronary  artery disease, status post four-vessel CABG in 1982 with a redo in 1997.   Subsequent NSTEMI in 2012, status post stenting to his native circumflex with residual   total occlusions of two vein grafts that were too small to intervene on. Subsequent  abnormal   nuclear stress test in 2015 and repeat left heart catheterization showing new lesion in   the RPDA that was not amenable to intervention. Has been medically managed since then.  2. Intolerance to Imdur (headache) and Ranexa (dizziness).   3. 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  4. Hypertension, improved with addition of daily amlodipine.  5. Hypertensive heart disease.  6. Obstructive sleep apnea, on CPAP.  7. Hyperlipidemia, on recently increased  dose of Crestor.  8. Diabetes mellitus.  9. Obesity, somewhat loosely back on the Ideal Protein diet,  has lost about 7 pounds since last visit.  10. 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  11. History of reactive airway disease.  12. Participation  in ACCELERATE trial, now concluded.  13. Recent bilateral cataract surgery.     PLAN:  1. Eufemio is doing well. I left him on the same medications. His blood pressure readings at home have been generally lower than was the case here.   2. Await lipid panel this primary care physician in two weeks and recheck in spring 2020.  3. Return visit in six months.  4. I thanked him so much for sharing some of his photography work with me.    This note was generated by the [facility]  EMR system/Dragon speech recognition and may contain errors or omissions not intended by the user.  Grammatical errors, random word insertions, deletions, pronoun errors, and incomplete sentences are occasional consequences of this technology due to software  limitations. Not all errors are caught or corrected. If there are questions or concerns about the content of this note or information contained within the body of this dictation, they should be addressed directly with the author for clarification.         Bruce Donath, MD, [facility]      cc: Dala Dock MD    ____________________________  TODAYS ORDERS  Diet_mgmt_edu,_guidance_and_counseling TODAY  Lipid Panel 2 weeks  Lipid Panel 6 months  Return Visit 15 MIN 6 months

## 2021-03-19 NOTE — Progress Notes (Signed)
[facility] OFFICE  [address] [place], [place] [address]     [name]    Date of Visit:  10/22/2015  Date of Birth: March 21, 1941  Age: 80 yrs.   Medical Record Number: [phone]  __  CURRENT DIAGNOSES     1. 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 Cardiac murmur, unspecified, R01.1  4. Chest pain, unspecified, R07.9   5. Abnormal electrocardiogram [ECG] [EKG], R94.31  6. 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 Pre-operative cardiovascular exam, V72.81  9. Cardiac murmur, unspecified, R01.1   10. DM Type II, Uncomplicated,Unspecified, 250.00  11. Hypercholesterolemia, 272.0  12. Obesity, unspecified, 278.00  13. Sleep Apnea, Obstructive, 327.23  14. 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 Abnormal Test-Abnormal  Lexiscan Stress Nuclear Study, 794.30  __  ALLERGIES    Atorvastatin, Muscle pains   __  MEDICATIONS     1. Cpap -  2. Crestor 20 mg Tablet, 1 po qd  3. fenofibrate micronized 134 mg capsule, 1 po qpm  4. losartan 100  mg-hydrochlorothiazide 12.5 mg tablet, 1 po qam  5. Aspirin Low Dose 81 mg tablet,delayed release, 1 po qam  6. Fish Oil Concentrate 1,000 mg capsule, EPA 1 po bid  7. folic acid 1 mg tablet, 2 po qam  8. Vitamin B-12 2,500 mcg sublingual  tablet, 1 SL QAM  9. Vitamin B-6 100 mg tablet, 1 po qam  10. Vitamin D3 2,000 unit capsule, 1 po qam  11. potassium 99 mg tablet, Ideal protein, 1 po qam  12. Acidophilus capsule, Ideal Protein 1 po qd  13. Zyrtec 10 mg tablet, prn   14. Multiple Vitamin-Minerals tablet, Ideal protein, take as directed  15. 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 He has been stable from a cardiac standpoint. He exercises regularly, with a fairly vigorous regimen, three to four days per week. He has had no difficulty with angina of late. He has been  off Ranexa and off of Imdur. He has gained some weight back since the last visit. He has had no orthopnea or PND. He has occasional ankle edema. Blood pressure is well controlled.    The patient's last evaluation was by cardiac catheterization  in 2015 showing a stable anatomy.  __  PAST HISTORY     Past Medical Illnesses : Asthma 1995, Diabetes mellitus-adult onset, Hyperlipidemia, Hypertension, OSA/CPAP,obesity, osteoarthritis;  Past Cardiac Illnesses : Coronary Artery Disease, hypertensive heart disease, S/P Myocardial infarction-non Q wave December 2011; Infectious Diseases: No previous history of  significant infectious diseases.; Surgical Procedures: Coronary Artery Bypass Surgery 1982, with 4 grafts, Redo CABG 1997, with 3 grafts, Cholecystectomy  1997, Kidney stone, shoulder surgery, left-03/2009, 11/29/10 l knee stem cell injection; Trauma History: No previous history of significant trauma.;  Cardiology Procedures-Invasive: Cardiac Cath (left) June 1982,  Cardiac Cath (left) September 1997, Cardiac Cath intracoronary stent December 2011; Cardiology  Procedures-Noninvasive: Echocardiogram August 2009, Adenosine Dual Isotope August 2009, Naughton treadmill stress test March 2012, MPI Dual Isotope Exercise May 2013, Echocardiogram May 2013, MPI 02/15/14,  Echocardiogram March 2015; Cardiac Cath Results: 03/02/2014 Cardiac Cath: Patent Left IMA to LAD, patent  previously stented spah vein graft to rt posterolateral,  patent previous stents to native circ., new disease to ostial proximal rt PDA; Left Ventricular Ejection Fraction: LVEF of 65% documented via echocardiogram  on 02/20/2014  ___  FAMILY HISTORY  Brother --  Coronary artery bypass grafting  Father -- Coronary artery bypass grafting    __  CARDIAC RISK FACTORS      Tobacco Abuse: has never used tobacco; Family History of Heart Disease: positive;  Hyperlipidemia: positive; Hypertension: positive;   Diabetes Mellitus: positive, Adult onset; Prior History of Heart Disease: positive;  Obesity: positive, BMI30 (Obesity), BMI 46 (02/2011); Sedentary Life Style:negative;  ZOX:[id number]; Menopausal:not applicable  __   SOCIAL HISTORY    Alcohol Use: Does not use alcohol;  Smoking: Does not smoke; Never smoker ([id number]); Diet: Regular diet, caffeine use and no frequency  noted; Exercise: Exercises regularly three times a week; 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;   __  REVIEW OF SYSTEMS    General : Feels well, no change in exercise tolerance., Positive for weight gain; Integumentary: Denies any change in hair or nails, rashes, or skin lesions.;  Eyes: Wears eye glasses/contact lenses; Ears, Nose, Throat, Mouth: Denies any hearing loss, epistaxis,  hoarseness or difficulty speaking.;Respiratory: Sleep apnea, CPAP; Cardiovascular : Denies palpitations, chest pain, orthopnea, PND, peripheral edema, syncope or claudication.; Abdominal : Denies ulcer disease, hematochezia or melena.; Musculoskeletal:Arthritis; Neurological : Denies any history of recurrent strokes, TIA, or seizure  disorder.; Psychiatric: Denies any history of depression, substance abuse or change in cognitive functions.;  Endocrine: Hyperlipidemia, non-insulin dependent diabetes mellitus; Hematologic/Immunologic: Seasonal  allergies  __  PHYSICAL EXAMINATION    Vital Signs:  Blood Pressure:   130/70 Sitting, Left arm, large cuff  130/70 Sitting, Right arm, large  cuff    Weight: 229.00 lbs.   Height: 66"  BMI: 37   Pulse:  51/min. Apical       Constitutional: Cooperative, alert and oriented,well developed, well nourished, in no acute distress., severely obese  Skin: warm and dry to touch Head: normocephalic, balding male hair pattern  Eyes: conjunctivae and lids unremarkable ENT: No pallor or cyanosis  Neck: thick neck, no obvious JVD or bruits, carotid pulses are full and equal bilaterally Chest: diminished  breath sounds, although clear Cardiac: soft S1 S2, soft systolic murmur evident, No S3 or S4, Regular rhythm, unable to palpate PMI due to body habitus  Abdomen: abdomen unremarkable, abdomen soft, obese Peripheral Pulses: diminished distal pulses bilaterally,  no bruits, unable to palpate femoral pulses due to obesity Extremities/Back: trace ankle/pedal edema bilaterally  Psychiatric: normal memory Neurological: No gross motor or sensory deficits noted, affect appropriate,  oriented to time, person and place.   __    Medications added today by the physician:      ECG:   Rest ECG: Sinus rhythm, nonspecific  T-wave abnormality.    IMPRESSIONS:   1. CAD status post CABG x2 in 1992 and subsequently in 1997. Most recent cardiac  catheterization, performed 2015, showed patent LIMA graft and patent vein graft to distal right  coronary artery  with patent stents to circumflex. He has had no angina of late. He does have  residual small branch vessel disease involving diagonal branch and distal right coronary artery  but remains asymptomatic.  2. Intolerance to Imdur with headache.   3. Intolerance to Ranexa with dizziness.  4. Obesity with some worsening obstructive sleep apnea, on CPAP.  5. Hypertension, controlled.  6. Dyslipidemia, reasonably controlled.  7. Diabetes.  8. History of reactive airway disease.     RECOMMENDATIONS:   1. Continue current medical regimen.  2. Beta blockers aborted due to resting bradycardia.  3. Return visit with Dr. Levy Sjogren in six months.  4.  Continue exercise as tolerated.  5. Dietary modifications discussed.   6. Repeat lipid profile.    Norville Haggard, MD, [facility]     Tid: [id number]    cc: Ardeen Garland III MD    ECA  ____________________________  Christianne Dolin  WJ:[id number]  Education ICD-10: I25.119 MedlinePlus Connect results for ICD-10 I25.119  Lipid Panel 1 week  Return Visit 15 MIN 6 months  12 Lead ECG Today  Diet mgmt edu, guidance and counseling TODAY

## 2021-03-19 NOTE — Progress Notes (Signed)
[facility] OFFICE  [address] [place], [place] [address]     [name]    Date of Visit:  05/08/2017  Date of Birth: [birth year]  Age: 80 yrs.   Medical Record Number: [id number]  __  CURRENT DIAGNOSES     1. Pure hypercholesterolemia, unspecified,  E78.00  2. 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 Cardiac murmur, unspecified,  R01.1  5. Chest pain, unspecified, R07.9  6. Abnormal electrocardiogram [ECG] [EKG], R94.31  7. 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 Pre-operative cardiovascular exam,  V72.81  10. Cardiac murmur, unspecified, R01.1  11. DM Type II, Uncomplicated,Unspecified, 250.00  12. Obesity, unspecified, 278.00  13. Sleep Apnea, Obstructive, 327.23  14. 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 Abnormal Test-Abnormal Lexiscan Stress Nuclear Study, 794.30  __  ALLERGIES    Atorvastatin,  Muscle pains  __  MEDICATIONS     1. Acidophilus capsule, Ideal Protein 1 po qd  2. Aspirin  Low Dose 81 mg tablet,delayed release, 1 po qam  3. Calcium 500 500 mg calcium (1,250 mg) tablet, 1 po qd  4. Cpap -  5. Crestor 20 mg Tablet, 1 po qd  6. fenofibrate micronized 134 mg capsule, 1 po qpm  7. Fish Oil Concentrate 1,000  mg capsule, EPA 1 po bid  8. folic acid 1 mg tablet, 2 po qam  9. losartan 100 mg-hydrochlorothiazide 12.5 mg tablet, 1 po qam  10. Multiple Vitamin-Minerals tablet, Ideal protein, take as directed  11. potassium 99 mg tablet, Ideal protein,  1 po qam  12. Vitamin B-12 2,500 mcg sublingual tablet, 1 SL QAM  13. Vitamin B-6 100 mg tablet, 1 po qam  14. Vitamin D3 5,000 unit tablet, 1 po qd  15. 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

## 2021-03-19 NOTE — Progress Notes (Signed)
[facility] OFFICE  [address] [place], [place] [address]     Travis Hogan    Date of Visit:  12/25/2014  Date of Birth: [birth year]  Age: 80 yrs.   Medical Record Number: [id number]  __  CURRENT DIAGNOSES     1. 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 Cardiac murmur, unspecified, R01.1  4. Chest pain, unspecified, 786.50   5. Abnormal electrocardiogram [ECG] [EKG], R94.31  6. 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 Pre-operative cardiovascular exam, V72.81  9. Abnormal Test-Abnormal Lexiscan Stress  Nuclear Study, 794.30  10. Cardiac murmur, unspecified, R01.1  11. DM Type II, Uncomplicated,Unspecified, 250.00  12. Hypercholesterolemia, 272.0  13. Obesity, unspecified, 278.00  14. Sleep Apnea, Obstructive, 327.23  15. Hypertensive  Heart Disease, Benign w/o CHF, 402.10  __  ALLERGIES    Atorvastatin, Muscle pains   __  MEDICATIONS     1. Cpap -  2. Crestor 20 mg Tablet, 1 po qd  3. fenofibrate micronized 134 mg capsule, 1 po qpm  4. losartan 100  mg-hydrochlorothiazide 12.5 mg tablet, 1 po qam  5. Aspirin Low Dose 81 mg tablet,delayed release, 1 po qam  6. Fish Oil Concentrate 1,000 mg capsule, EPA 1 po bid  7. folic acid 1 mg tablet, 2 po qam  8. Vitamin B-12 2,500 mcg sublingual  tablet, 1 SL QAM  9. Vitamin B-6 100 mg tablet, 1 po qam  10. Vitamin D3 2,000 unit capsule, 1 po qam  11. potassium 99 mg tablet, Ideal protein, 1 po qam  12. Acidophilus capsule, Ideal Protein 1 po qd  13. Zyrtec 10 mg tablet, prn   14. Multiple Vitamin-Minerals tablet, Ideal protein, take as directed  15. Ranexa 500 mg tablet,extended release, 1 po bid  16. famotidine 40 mg tablet, 1 po qd  17. Calcium 500 500 mg calcium (1,250 mg) tablet, 1 po qd    __   HISTORY OF PRESENT ILLNESS  Travis Hogan  returns today for followup. About a month after I last saw him, he felt that he was getting dizzy from the Ranexa, and he stopped it after a month. He has had no angina  since then, and his symptoms have resolved. His participation in the ACCELERATE trial was ended, since the trial was being concluded. He did check his own cholesterol with his primary care physician, which showed his HDL was now 53, his LDL was 91. His  HDL was quite low before, indicating that he may have been getting the study drug; however, the entire trial will be unblinded in six months.     _  PAST HISTORY      Past Medical Illnesses: Asthma 1995, Diabetes mellitus-adult onset, Hyperlipidemia, Hypertension, OSA/CPAP,obesity, osteoarthritis;   Past Cardiac Illnesses: Coronary Artery Disease, hypertensive heart disease, S/P Myocardial infarction-non Q wave December 2011; Infectious Diseases : No previous history of significant infectious diseases.; Surgical Procedures: Coronary Artery Bypass Surgery 1982, with 4 grafts, Redo CABG 1997, with  3 grafts, Cholecystectomy 1997, Kidney stone, shoulder surgery, left-03/2009, 11/29/10 l knee stem cell injection; Trauma History: No previous history of  significant trauma.; Cardiology Procedures-Invasive: Cardiac Cath (left) June 1982, Cardiac Cath (left) September 1997, Cardiac Cath intracoronary stent  December 2011; Cardiology Procedures-Noninvasive: Echocardiogram August 2009, Adenosine Dual Isotope August 2009, Naughton treadmill stress  test March  2012, MPI Dual Isotope Exercise May 2013, Echocardiogram May 2013, MPI 02/15/14, Echocardiogram March 2015; Cardiac Cath Results: 03/02/2014 Cardiac Cath:  Patent Left IMA to LAD, patent previously stented spah vein graft to rt posterolateral, patent previous stents to native circ., new disease to ostial proximal rt PDA; Left Ventricular Ejection Fraction : LVEF of 65% documented via echocardiogram on 02/20/2014  ___  FAMILY HISTORY  Brother --  Coronary  artery bypass grafting  Father -- Coronary artery bypass grafting    SOCIAL HISTORY     Alcohol Use: Does not use alcohol; Smoking: Does not smoke; Never smoker ([id number]);  Diet: Regular diet, caffeine use and no frequency noted; Exercise: Exercises regularly three times  a week; 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;   __   REVIEW OF SYSTEMS    General: Feels well, no change in exercise tolerance.;  Integumentary: Denies any change in hair or nails, rashes, or skin lesions.; Eyes: Wears eye glasses/contact  lenses; Ears, Nose, Throat, Mouth: Denies any hearing loss, epistaxis, hoarseness or difficulty speaking.; Respiratory: Sleep apnea, CPAP; Cardiovascular: Denies palpitations, chest pain, orthopnea, PND, peripheral  edema, syncope or claudication.; Abdominal : Denies ulcer disease, hematochezia or melena.;Musculoskeletal :Arthritis; Neurological : Denies any history of recurrent strokes, TIA, or seizure disorder.; Psychiatric : Denies any history of depression, substance abuse or change in cognitive functions.; Endocrine: Hyperlipidemia, non-insulin dependent diabetes mellitus;  Hematologic/Immunologic: Seasonal allergies  __  PHYSICAL EXAMINATION    Vital Signs:   Blood Pressure:  118/70 Sitting, Left arm, large cuff  122/70 Sitting, Right arm, large cuff    Weight:  224.00 lbs.  Height: 66"  BMI: 36    Pulse: 54/min.       Constitutional: Cooperative, alert and oriented,well developed, well  nourished, in no acute distress., severely obese Skin: warm and dry to touch Head : normocephalic, balding male hair pattern Eyes: conjunctivae and lids unremarkable ENT : No pallor or cyanosis Neck: thick neck, no obvious JVD or bruits, carotid pulses are full and equal bilaterally  Chest: diminished breath sounds, although clear Cardiac: soft S1 S2, soft systolic murmur evident,  No S3 or S4, Regular rhythm, unable to palpate PMI due to body habitus Abdomen: abdomen unremarkable, abdomen soft, obese  Peripheral Pulses:  diminished distal pulses bilaterally, no bruits, unable to palpate femoral pulses due to obesity Extremities/Back : trace ankle/pedal edema bilaterally Psychiatric: normal memory Neurological : No gross motor or sensory deficits noted, affect appropriate, oriented to time, person and place.   __    Medications added today by the physician:     IMPRESSIONS:  1. CAD status post remote four vessel bypass in 1982 and redo CABG in 1997.   a. Abnormal nuclear study 08/09, small inferior defect, possible   diaphragmatic attenuation artifact, normal EF.   b. 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 The "culprit" vein graft to diagonal was   felt to be too small of a vessel to intervene on.   c. Recurrent  chest pain with MPI 02/15/14 now showing inferior and   anterobasal ischemia. (new from 2013).  d.  Cath 03/02/14: new lesion in RPDA, not approavhable via either   native RCA or graft (Graft goes to RPL branch). Anatomy   otherwise unchanged--medical  therapy.  e. Intolerant of Imdur (headache) and Ranexa (dizziness)--no angina  currently despite being off both meds.  2. Obesity.  3. OSA with CPAP.  4. Hypertension/HCVD, blood pressure is reasonable   5. Dyslipidemia.  6.  Diabetes mellitus.   7. History of reactive airways tolerating current medications.  8. Effient GERD.  9. Participation in the Accelerate Trial, now concluded.        RECOMMENDATIONS:   1. For now, since he really does not have angina, he will remain off Imdur or Ranexa.  2. No beta blocker, given his bradycardia; however, if angina becomes an issue, could   try Norvasc.  3. Return visit with me  in four months' time.   4. Okay to continue his exercise program as he is doing.    Bruce Donath, M.D., F.A.C.C.     Tid: [id number]    cc: Ardeen Garland III  MD    rw      ____________________________  Christianne Dolin  Return Visit 15 MIN 4 months  Diet mgmt edu, guidance and counseling  TODAY        Bruce Donath, MD, [facility]

## 2021-03-26 ENCOUNTER — Other Ambulatory Visit (INDEPENDENT_AMBULATORY_CARE_PROVIDER_SITE_OTHER): Payer: Self-pay

## 2021-03-26 ENCOUNTER — Other Ambulatory Visit (INDEPENDENT_AMBULATORY_CARE_PROVIDER_SITE_OTHER): Payer: Self-pay | Admitting: Cardiovascular Disease

## 2021-03-26 DIAGNOSIS — I25119 Atherosclerotic heart disease of native coronary artery with unspecified angina pectoris: Secondary | ICD-10-CM

## 2021-03-26 MED ORDER — EZETIMIBE 10 MG PO TABS
10.0000 mg | ORAL_TABLET | Freq: Every day | ORAL | 3 refills | Status: DC
Start: 2021-03-26 — End: 2022-02-04

## 2021-04-24 ENCOUNTER — Other Ambulatory Visit (INDEPENDENT_AMBULATORY_CARE_PROVIDER_SITE_OTHER): Payer: Self-pay

## 2021-04-24 MED ORDER — ROSUVASTATIN CALCIUM 40 MG PO TABS
40.0000 mg | ORAL_TABLET | Freq: Every day | ORAL | 2 refills | Status: DC
Start: 2021-04-24 — End: 2021-11-25

## 2021-04-24 NOTE — Telephone Encounter (Signed)
Refill voicemail: Crestor 40mg .     Chart reviewed- patient saw Dr Levy Sjogren on 02/28/2021. RX sent to pharmacy requested, [facility].

## 2021-07-01 ENCOUNTER — Ambulatory Visit
Admission: RE | Admit: 2021-07-01 | Discharge: 2021-07-01 | Disposition: A | Discharge status: Home / Self Care | Payer: Medicare Other | Source: Ambulatory Visit | Attending: Cardiovascular Disease | Admitting: Cardiovascular Disease

## 2021-07-01 DIAGNOSIS — I10 Essential (primary) hypertension: Secondary | ICD-10-CM | POA: Insufficient documentation

## 2021-07-01 DIAGNOSIS — I25119 Atherosclerotic heart disease of native coronary artery with unspecified angina pectoris: Secondary | ICD-10-CM | POA: Insufficient documentation

## 2021-07-01 DIAGNOSIS — I480 Paroxysmal atrial fibrillation: Secondary | ICD-10-CM | POA: Insufficient documentation

## 2021-07-01 LAB — MAGNESIUM: Magnesium: 1.7 mg/dL (ref 1.6–2.6)

## 2021-07-01 LAB — BASIC METABOLIC PANEL
Anion Gap: 9 (ref 5.0–15.0)
BUN: 24 mg/dL (ref 9.0–28.0)
CO2: 22 mEq/L (ref 21–29)
Calcium: 9.1 mg/dL (ref 7.9–10.2)
Chloride: 103 mEq/L (ref 100–111)
Creatinine: 1.2 mg/dL (ref 0.5–1.5)
Glucose: 174 mg/dL — ABNORMAL HIGH (ref 70–100)
Potassium: 4.3 mEq/L (ref 3.5–5.1)
Sodium: 134 mEq/L — ABNORMAL LOW (ref 136–145)

## 2021-07-01 LAB — HEMOLYSIS INDEX: Hemolysis Index: 9 Index (ref 0–24)

## 2021-07-01 LAB — GFR: EGFR: 58

## 2021-07-01 LAB — TSH: TSH: 3.54 u[IU]/mL (ref 0.35–4.94)

## 2021-07-03 NOTE — Progress Notes (Signed)
[place] [facility] CARDIOLOGY OFFICE PROGRESS NOTE    HRT [facility] OFFICE      [place] [facility] OFFICE -CARDIOLOGY  [address]  [place] [address]  Dept: [phone]  Dept Fax: [phone]       Patient Name: Travis Hogan    Date of Visit:  July 03, 2021  Date of Birth: [birth year]  AGE: 80 y.o.  Medical Record #: [id number]  Requesting Physician: Conception Oms, MD      CHIEF COMPLAINT: No chief complaint on file.      HISTORY OF PRESENT ILLNESS:    Travis Hogan returns the office today for follow-up.     By way of review, he had a recurrence of atrial fibrillation October 2021.  At that point time he seemed to be symptomatic.  He was set up for cardioversion and this was successfully performed on 10/26/2020, however shortly thereafter he realized he was probably back out of rhythm.  Seen in the office on December 2 where an EKG confirmed recurrence of atrial fibrillation although rate controlled.  At that point time was unclear if he was truly symptomatic, and he was set up with electrophysiology.  He saw Dr. Ida Rogue on 11/29/2020 and he appeared to have rate control, while still in atrial fibrillation.  He cannot really identify the risk of asymptomatic and was discussed that maintain normal rhythm was likely going to require hospitalization for Tikosyn, or amiodarone.  Obviously this would also require another cardioversion.  We then decided to see how he did before proceeding.  I had seen him in the office about 4 months ago.    He is still working out regularly.  In fact he seems to have better exercise tolerance of many of his peers.  His blood pressure cuff reads irregular almost invariably.  However the patient really at this point cannot ascribe any specific symptoms to this.  He thinks his stamina is declined but he thinks that some a different timetable than the reappearance of the atrial fibrillation last fall.  He has no overt palpitations, or chest pain.      PAST MEDICAL  HISTORY: He has a past medical history of Arrhythmia, Arthritis, Asthma without status asthmaticus, Blood transfusion without reported diagnosis (1997), Coronary artery disease (2011), Diabetes mellitus type II (2007), Hyperlipidemia, Hypertensive disorder, Kidney stone (2010), Skin cancer (1980'S), and Sleep apnea. He has a past surgical history that includes Joint replacement (2010); Cholecystectomy (1997); STEM CELL KNEE (2010); Coronary angioplasty with stent (DEC 2011); Cardiac surgery (1982, 1997); and Colonoscopy (2007).    ALLERGIES:   Allergies   Allergen Reactions    Atorvastatin Other (See Comments)     Muscle Pain       MEDICATIONS:   Current Outpatient Medications:     apixaban (ELIQUIS) 5 MG, Take 1 tablet (5 mg total) by mouth every 12 (twelve) hours, Disp: 180 tablet, Rfl: 3    aspirin EC 81 MG EC tablet, Take 81 mg by mouth daily, Disp: , Rfl:     Calcium-Magnesium 100-50 MG Tab, Take by mouth daily, Disp: , Rfl:     cetirizine (ZYRTEC) 10 MG tablet, Take 10 mg by mouth as needed in the morning., Disp: , Rfl:     ezetimibe (ZETIA) 10 MG tablet, Take 1 tablet (10 mg total) by mouth daily, Disp: 90 tablet, Rfl: 3    fenofibrate micronized (LOFIBRA) 134 MG capsule, Take 134 mg by mouth every morning before breakfast., Disp: , Rfl:  losartan-hydrochlorothiazide (HYZAAR) 100-12.5 MG per tablet, Take 1 tablet by mouth every morning., Disp: , Rfl:     metoprolol tartrate (LOPRESSOR) 25 MG tablet, Take 0.5 tablets (12.5 mg total) by mouth 2 (two) times daily, Disp: 180 tablet, Rfl: 1    Potassium 99 MG Tab, Take 1 tablet by mouth daily, Disp: , Rfl:     Pyridoxine HCl (VITAMIN B-6) 100 MG tablet, Take 100 mg by mouth daily., Disp: , Rfl:     rosuvastatin (CRESTOR) 40 MG tablet, Take 1 tablet (40 mg total) by mouth daily, Disp: 90 tablet, Rfl: 2    Vitamin D3 (CHOLECALCIFEROL) 125 MCG (5000 UT) Tab tablet, Take 2,000 Units by mouth once a week Takes on Monday, Disp: , Rfl:     vitamins/minerals Tab, Take  1 tablet by mouth daily, Disp: , Rfl:      FAMILY HISTORY: family history is not on file.    SOCIAL HISTORY: He reports that he has never smoked. He has never used smokeless tobacco. He reports that he does not drink alcohol and does not use drugs.    PHYSICAL EXAMINATION    There were no vitals taken for this visit.    General Appearance:  A well-appearing male in no acute distress.    Skin: Warm and dry to touch   Head: Normocephalic  Eyes: EOMS Intact   ENT: masked  Neck: JVP normal, no carotid bruit  Chest: Clear to auscultation bilaterally    Cardiovascular: Irregularly irregular, normal S1-S2, soft holosystolic murmur  Abdomen: No guarding  Extremities: Warm without pitting edema.   Neuro: Alert and oriented x3. No gross motor or sensory deficits noted, affect appropriate.          LABS:   Lab Results   Component Value Date    WBC 6.79 01/05/2021    HGB 14.6 01/05/2021    HCT 42.3 01/05/2021    PLT 261 01/05/2021     Lab Results   Component Value Date    GLU 174 (H) 07/01/2021    BUN 24.0 07/01/2021    CREAT 1.2 07/01/2021    NA 134 (L) 07/01/2021    K 4.3 07/01/2021    CL 103 07/01/2021    CO2 22 07/01/2021    AST 21 11/17/2010    ALT 26 11/17/2010     Lab Results   Component Value Date    MG 1.7 07/01/2021    TSH 3.54 07/01/2021     No results found for: CHOL, TRIG, HDL, LDL        IMPRESSION:   Travis Hogan is a 80 y.o. male with the following problems:     Persistent atrial fibrillation, new diagnosis as of 09/18/2020, rate controlled, and back in AF after Nov 2021 DCCV   CHADSVASc at least 5 for age, htn, dm, cad    Unclear duration, but seems like it have begun at the beginning of October.  Successful cardioversion however ~21 by early recurrence of atrial fibrillation.  Remains in atrial fibrillation at this time, although not clearly symptomatic,  TTE  Nov 2021 EF, trace to mild AI, mildly dilated left atrium by volume index.  Coronary artery disease.  Status post four-vessel CABG in 1982 with a redo in  1997.  Subsequent NSTEMI in 2012, status post stenting to his native circumflex with residual total occlusions of two vein grafts that were too small to intervene on.  Subsequent abnormal nuclear stress test in 2015 and repeat left heart catheterization  showing new lesion in the RPDA that was not amenable to intervention.  Has been medically managed since then.  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  Intolerance to Imdur (headache) and Ranexa (dizziness).  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  Hypertension,  controlled  Echocardiogram May 2021: EF 55 to 60%, grade 1 diastolic dysfunction.  Pedal edema, worse recently, suspect this may have been influenced little bit by the increase in his amlodipine dose.  Hypertensive heart disease.  Obstructive sleep apnea, on CPAP.  Hyperlipidemia, on recently increased dose of Crestor.  Last LDL July was around target at 75 but he is now running a little bit about that consistently, LDL now back up to 84.Marland Kitchen  HDL remains low chronically.  Diabetes mellitus.    Obesity, somewhat loosely back on the Ideal Protein diet, has lost about 7 pounds since last visit.  He is now supplementing with IP foods but he is not ketotic.  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  History of reactive airway disease.  Participation in [place] trial, now concluded.  S/p bilateral cataract surgery.      RECOMMENDATIONS:     Continue Eliquis 5 mg p.o. twice daily and other cardiac medicines  After long discussion with him today I am satisfied that he is not really particularly symptomatic with atrial fibrillation.  After further discussion we decided that we will continue with rate control and anticoagulation.  He will have an echocardiogram in about 4 months I will follow-up with him then.  If it is clear the developing LV dysfunction from this approach, we can reconsider options.  From a symptom standpoint, I do  not think he is going to gain much from the more aggressive approach based on anything we have been seeing after observing this now for several months.  Should his symptoms that change in the interim, Travis Hogan will contact the office.  Otherwise I will follow-up with him around Thanksgiving.                                                   No orders of the defined types were placed in this encounter.      No orders of the defined types were placed in this encounter.      SIGNED:    Lelan Pons, MD          This note was generated by the Dragon speech recognition and may contain errors or omissions not intended by the user. Grammatical errors, random word insertions, deletions, pronoun errors, and incomplete sentences are occasional consequences of this technology due to software limitations. Not all errors are caught or corrected. If there are questions or concerns about the content of this note or information contained within the body of this dictation, they should be addressed directly with the author for clarification.

## 2021-07-04 ENCOUNTER — Ambulatory Visit (INDEPENDENT_AMBULATORY_CARE_PROVIDER_SITE_OTHER): Payer: Medicare Other | Admitting: Cardiovascular Disease

## 2021-07-04 ENCOUNTER — Encounter (INDEPENDENT_AMBULATORY_CARE_PROVIDER_SITE_OTHER): Payer: Self-pay | Admitting: Cardiovascular Disease

## 2021-07-04 DIAGNOSIS — I4819 Other persistent atrial fibrillation: Secondary | ICD-10-CM

## 2021-07-19 ENCOUNTER — Other Ambulatory Visit (INDEPENDENT_AMBULATORY_CARE_PROVIDER_SITE_OTHER): Payer: Self-pay | Admitting: Physician Assistant

## 2021-10-24 ENCOUNTER — Ambulatory Visit (INDEPENDENT_AMBULATORY_CARE_PROVIDER_SITE_OTHER): Payer: Medicare Other

## 2021-10-24 ENCOUNTER — Encounter (INDEPENDENT_AMBULATORY_CARE_PROVIDER_SITE_OTHER): Payer: Self-pay | Admitting: Cardiology

## 2021-10-24 DIAGNOSIS — I4819 Other persistent atrial fibrillation: Secondary | ICD-10-CM

## 2021-11-23 ENCOUNTER — Other Ambulatory Visit (INDEPENDENT_AMBULATORY_CARE_PROVIDER_SITE_OTHER): Payer: Self-pay | Admitting: Cardiovascular Disease

## 2021-12-02 ENCOUNTER — Ambulatory Visit (INDEPENDENT_AMBULATORY_CARE_PROVIDER_SITE_OTHER): Payer: Medicare Other | Admitting: Cardiovascular Disease

## 2021-12-05 ENCOUNTER — Ambulatory Visit (INDEPENDENT_AMBULATORY_CARE_PROVIDER_SITE_OTHER): Payer: Medicare Other | Admitting: Cardiovascular Disease

## 2021-12-05 ENCOUNTER — Encounter (INDEPENDENT_AMBULATORY_CARE_PROVIDER_SITE_OTHER): Payer: Self-pay | Admitting: Cardiovascular Disease

## 2021-12-05 VITALS — BP 130/76 | HR 66 | Ht 65.0 in | Wt 258.4 lb

## 2021-12-05 DIAGNOSIS — I4819 Other persistent atrial fibrillation: Secondary | ICD-10-CM

## 2021-12-05 DIAGNOSIS — Z6841 Body Mass Index (BMI) 40.0 and over, adult: Secondary | ICD-10-CM

## 2021-12-05 DIAGNOSIS — I2510 Atherosclerotic heart disease of native coronary artery without angina pectoris: Secondary | ICD-10-CM

## 2021-12-05 DIAGNOSIS — I25119 Atherosclerotic heart disease of native coronary artery with unspecified angina pectoris: Secondary | ICD-10-CM

## 2021-12-05 NOTE — Progress Notes (Signed)
Travis Hogan    HRT [facility] OFFICE      [facility] HEART [facility] OFFICE -CARDIOLOGY  [address]  [place] [address]  Dept: [phone]  Dept Fax: [phone]       Patient Name: Travis Hogan    Date of Visit:  December 05, 2021  Date of Birth: December 01, 1941  AGE: 80 y.o.  Medical Record #: [id number]  Requesting Physician: Conception Oms, MD      CHIEF COMPLAINT: No chief complaint on file.        HISTORY OF PRESENT ILLNESS:    Travis Hogan returns the office today for follow-up.     By way of review, Travis Hogan had Travis recurrence of atrial fibrillation October 2021.  At that point time Travis Hogan seemed to be symptomatic.  Travis Hogan was set up for cardioversion and this was successfully performed on 10/26/2020, however shortly thereafter Travis Hogan realized Travis Hogan was probably back out of rhythm.  Seen in the office on December 2 where an EKG confirmed recurrence of atrial fibrillation although rate controlled.  At that point time was unclear if Travis Hogan was truly symptomatic, and Travis Hogan was set up with electrophysiology.  Travis Hogan saw Dr. Ida Rogue on 11/29/2020 and Travis Hogan appeared to have rate control, while still in atrial fibrillation.  Travis Hogan cannot really identify the risk of asymptomatic and was discussed that maintain normal rhythm was likely going to require hospitalization for Tikosyn, or amiodarone.  Obviously this would also require another cardioversion.  We then decided to see how Travis Hogan did before proceeding.     Have not seen him about 3 times since his initial decision and Travis Hogan is actually doing the same.  Travis Hogan really has no specific interest and Travis more aggressive treatment plan is as Travis Hogan says Travis Hogan basely feels the same as they did before, does all the same things Travis Hogan would normally do and his repeat echocardiogram [place] function remain normal.  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  But Travis Hogan is feeling his time with other ways, Travis Hogan still works out regularly  although his weight loss, which has been pretty significant early in the year, appears to have stalled.    Blood pressure readings at home are excellent.      PAST MEDICAL HISTORY: Travis Hogan has Travis past medical history of Arrhythmia, Arthritis, Asthma without status asthmaticus, Blood transfusion without reported diagnosis (1997), Coronary artery disease (2011), Diabetes mellitus type II (2007), Hyperlipidemia, Hypertensive disorder, Kidney stone (2010), Skin cancer (1980'S), and Sleep apnea. Travis Hogan has Travis past surgical history that includes Joint replacement (2010); Cholecystectomy (1997); STEM CELL KNEE (2010); Coronary angioplasty with stent (DEC 2011); Cardiac surgery (1982, 1997); and Colonoscopy (2007).    ALLERGIES:   Allergies   Allergen Reactions    Atorvastatin Other (See Comments)     Muscle Pain       MEDICATIONS:   Current Outpatient Medications:     apixaban (ELIQUIS) 5 MG, Take 1 tablet (5 mg total) by mouth every 12 (twelve) hours, Disp: 180 tablet, Rfl: 3    aspirin EC 81 MG EC tablet, Take 81 mg by mouth three times Travis week, Disp: , Rfl:     Calcium-Magnesium 100-50 MG Tab, Take by mouth daily, Disp: , Rfl:     cetirizine (ZYRTEC) 10 MG tablet, Take 10 mg by mouth as needed in the morning., Disp: , Rfl:     ezetimibe (ZETIA) 10 MG  tablet, Take 1 tablet (10 mg total) by mouth daily, Disp: 90 tablet, Rfl: 3    fenofibrate micronized (LOFIBRA) 134 MG capsule, Take 134 mg by mouth every morning before breakfast., Disp: , Rfl:     losartan-hydrochlorothiazide (HYZAAR) 100-12.5 MG per tablet, Take 1 tablet by mouth every morning., Disp: , Rfl:     metoprolol tartrate (LOPRESSOR) 25 MG tablet, TAKE ONE-HALF TABLET BY  MOUTH TWICE DAILY, Disp: 180 tablet, Rfl: 3    Potassium 99 MG Tab, Take 1 tablet by mouth daily, Disp: , Rfl:     Pyridoxine HCl (VITAMIN B-6) 100 MG tablet, Take 100 mg by mouth daily., Disp: , Rfl:     rosuvastatin (CRESTOR) 40 MG tablet, TAKE 1 TABLET BY MOUTH  DAILY, Disp: 90 tablet, Rfl: 1     spironolactone (ALDACTONE) 25 MG tablet, Take 25 mg by mouth daily, Disp: , Rfl:     Vitamin D3 (CHOLECALCIFEROL) 125 MCG (5000 UT) Tab tablet, Take 2,000 Units by mouth once Travis week Takes on Monday, Disp: , Rfl:     vitamins/minerals Tab, Take 1 tablet by mouth daily, Disp: , Rfl:      FAMILY HISTORY: family history is not on file.    SOCIAL HISTORY: Travis Hogan reports that Travis Hogan has never smoked. Travis Hogan has never used smokeless tobacco. Travis Hogan reports that Travis Hogan does not drink alcohol and does not use drugs.    PHYSICAL EXAMINATION    There were no vitals taken for this visit.    General Appearance:  Travis well-appearing Hogan in no acute distress.    Skin: Warm and dry to touch   Head: Normocephalic  Eyes: EOMS Intact   ENT: masked  Neck: JVP normal, no carotid bruit  Chest: Clear to auscultation bilaterally    Cardiovascular: Irregularly irregular, normal S1-S2, soft holosystolic murmur  Abdomen: No guarding  Extremities: Warm without pitting edema.   Neuro: Alert and oriented x3. No gross motor or sensory deficits noted, affect appropriate.          LABS:   Lab Results   Component Value Date    WBC 6.79 01/05/2021    HGB 14.6 01/05/2021    HCT 42.3 01/05/2021    PLT 261 01/05/2021     Lab Results   Component Value Date    GLU 174 (H) 07/01/2021    BUN 24.0 07/01/2021    CREAT 1.2 07/01/2021    NA 134 (L) 07/01/2021    K 4.3 07/01/2021    CL 103 07/01/2021    CO2 22 07/01/2021    AST 21 11/17/2010    ALT 26 11/17/2010     Lab Results   Component Value Date    MG 1.7 07/01/2021    TSH 3.54 07/01/2021     Echo 10/2021:  Conclusions: 1. Normal left ventricular size, wall motion and ejection               fraction of 57%.                  2. Compared to the prior study on 10/23/2020, left ventricular               function remains preserved.      IMPRESSION:   Travis Hogan is Travis 80 y.o. Hogan with the following problems:     Persistent atrial fibrillation, new diagnosis as of 09/18/2020, rate controlled, and back in AF after Nov 2021 DCCV    CHADSVASc at least 5 for  age, htn, dm, cad    Unclear duration, but seems like it have begun at the beginning of October 2021.  Successful cardioversion however ~21 by early recurrence of atrial fibrillation.  Remains in atrial fibrillation at this time, although not clearly symptomatic,  TTE  Nov 2021 EF, trace to mild AI, mildly dilated left atrium by volume index.  Coronary artery disease.  Status post four-vessel CABG in 1982 with Travis redo in 1997.  Subsequent NSTEMI in 2012, status post stenting to his native circumflex with residual total occlusions of two vein grafts that were too small to intervene on.  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  Has been medically managed since then.  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  Intolerance to Imdur (headache) and Ranexa (dizziness).  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  Hypertension,  controlled  Echocardiogram May 2021: EF 55 to 60%, grade 1 diastolic dysfunction.  Pedal edema, worse recently, suspect this may have been influenced little bit by the increase in his amlodipine dose.  Hypertensive heart disease.  Obstructive sleep apnea, on CPAP.  Hyperlipidemia, on recently increased dose of Crestor.  Last LDL July was around target at 75 but Travis Hogan is now running Travis little bit about that consistently, LDL now back up to 84.Marland Kitchen  HDL remains low chronically.  Diabetes mellitus.  Obesity, somewhat loosely back on the Ideal Protein diet, has lost about 7 pounds since last visit.  Travis Hogan is now supplementing with IP foods but Travis Hogan is not ketotic.  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  History of reactive airway disease.  Participation in [place] trial, now concluded.  S/p bilateral cataract surgery.      RECOMMENDATIONS:     Continue Eliquis 5 mg p.o. twice daily and other  cardiac medicines  Continue rate control.  No plans for trying to reestablish sinus rhythm and states Travis Hogan is not symptomatic and this would probably involve Tikosyn or sotalol.  I mentioned Travis Hogan may want to talk to his primary care physician about Ozempic or [phone] as his weight loss the patient is stalled and Travis Hogan is still needing some additional treatment for his blood sugar.  Follow-up visits in 6 and 12 months.                                                 No orders of the defined types were placed in this encounter.        No orders of the defined types were placed in this encounter.        SIGNED:    Lelan Pons, MD          This Hogan was generated by the Dragon speech recognition and may contain errors or omissions not intended by the user. Grammatical errors, random word insertions, deletions, pronoun errors, and incomplete sentences are occasional consequences of this technology due to software limitations. Not all errors are caught or corrected. If there are questions or concerns about the content of this Hogan or information contained within the body of this dictation, they should be addressed directly with the author for clarification.

## 2021-12-10 ENCOUNTER — Other Ambulatory Visit (INDEPENDENT_AMBULATORY_CARE_PROVIDER_SITE_OTHER): Payer: Self-pay | Admitting: Physician Assistant

## 2021-12-10 DIAGNOSIS — I2510 Atherosclerotic heart disease of native coronary artery without angina pectoris: Secondary | ICD-10-CM

## 2021-12-11 MED ORDER — APIXABAN 5 MG PO TABS
5.0000 mg | ORAL_TABLET | Freq: Two times a day (BID) | ORAL | 0 refills | Status: DC
Start: 2021-12-11 — End: 2022-02-11

## 2022-02-04 ENCOUNTER — Other Ambulatory Visit (INDEPENDENT_AMBULATORY_CARE_PROVIDER_SITE_OTHER): Payer: Self-pay | Admitting: Cardiovascular Disease

## 2022-02-04 DIAGNOSIS — I25119 Atherosclerotic heart disease of native coronary artery with unspecified angina pectoris: Secondary | ICD-10-CM

## 2022-02-11 ENCOUNTER — Other Ambulatory Visit (INDEPENDENT_AMBULATORY_CARE_PROVIDER_SITE_OTHER): Payer: Self-pay | Admitting: Physician Assistant

## 2022-02-11 DIAGNOSIS — I2510 Atherosclerotic heart disease of native coronary artery without angina pectoris: Secondary | ICD-10-CM

## 2022-03-27 ENCOUNTER — Other Ambulatory Visit (INDEPENDENT_AMBULATORY_CARE_PROVIDER_SITE_OTHER): Payer: Self-pay | Admitting: Cardiovascular Disease

## 2022-03-27 ENCOUNTER — Other Ambulatory Visit (INDEPENDENT_AMBULATORY_CARE_PROVIDER_SITE_OTHER): Payer: Self-pay | Admitting: Physician Assistant

## 2022-03-27 DIAGNOSIS — I25119 Atherosclerotic heart disease of native coronary artery with unspecified angina pectoris: Secondary | ICD-10-CM

## 2022-03-27 MED ORDER — ROSUVASTATIN CALCIUM 40 MG PO TABS
40.0000 mg | ORAL_TABLET | Freq: Every day | ORAL | 2 refills | Status: DC
Start: 2022-03-27 — End: 2023-01-15

## 2022-03-27 MED ORDER — EZETIMIBE 10 MG PO TABS
10.0000 mg | ORAL_TABLET | Freq: Every day | ORAL | 2 refills | Status: DC
Start: 2022-03-27 — End: 2023-02-13

## 2022-03-27 MED ORDER — METOPROLOL TARTRATE 25 MG PO TABS
12.5000 mg | ORAL_TABLET | Freq: Two times a day (BID) | ORAL | 2 refills | Status: DC
Start: 2022-03-27 — End: 2023-01-15

## 2022-04-15 ENCOUNTER — Other Ambulatory Visit (INDEPENDENT_AMBULATORY_CARE_PROVIDER_SITE_OTHER): Payer: Self-pay | Admitting: Physician Assistant

## 2022-04-15 DIAGNOSIS — I2510 Atherosclerotic heart disease of native coronary artery without angina pectoris: Secondary | ICD-10-CM

## 2022-04-24 ENCOUNTER — Ambulatory Visit
Admission: RE | Admit: 2022-04-24 | Discharge: 2022-04-24 | Disposition: A | Discharge status: Home / Self Care | Payer: Medicare Other | Source: Ambulatory Visit | Attending: Cardiovascular Disease | Admitting: Cardiovascular Disease

## 2022-04-24 DIAGNOSIS — I4819 Other persistent atrial fibrillation: Secondary | ICD-10-CM | POA: Insufficient documentation

## 2022-04-24 DIAGNOSIS — I2510 Atherosclerotic heart disease of native coronary artery without angina pectoris: Secondary | ICD-10-CM | POA: Insufficient documentation

## 2022-04-24 LAB — LIPID PANEL
Cholesterol / HDL Ratio: 3.3 Index
Cholesterol: 105 mg/dL (ref 0–199)
HDL: 32 mg/dL — ABNORMAL LOW (ref 40–9999)
LDL Calculated: 59 mg/dL (ref 0–99)
Triglycerides: 69 mg/dL (ref 34–149)
VLDL Calculated: 14 mg/dL (ref 10–40)

## 2022-05-07 NOTE — Progress Notes (Unsigned)
[facility] C[facility] OFF[facility] PROGRESS NOTE    HRT [facility] OFFICE      [facility]ART [facility] OFFICE -CARDIOLOGY  [address]  [place] [address]  Dept: [phone]  Dept Fax: [phone]       Patient Name: Travis Hogan    Date of Visit:  May 07, 2022  Date of Birth: 10/27/1941  AGE: 81 y.o.  Medical Record #: [id number]  Requesting Physician: Conception Oms, MD      CHIEF COMPLAINT: No chief complaint on file.        HISTORY OF PRESENT ILLNESS:    Travis Hogan returns the office today for follow-up.     By way of review, he had a recurrence of atrial fibrillation October 2021.  At that point time he seemed to be symptomatic.  He was set up for cardioversion and this was successfully performed on 10/26/2020, however shortly thereafter he realized he was probably back out of rhythm.  Seen in the office on December 2 where an EKG confirmed recurrence of atrial fibrillation although rate controlled.  At that point time was unclear if he was truly symptomatic, and he was set up with electrophysiology.  He saw Dr. Ida Rogue on 11/29/2020 and he appeared to have rate control, while still in atrial fibrillation.  He cannot really identify the risk of asymptomatic and was discussed that maintain normal rhythm was likely going to require hospitalization for Tikosyn, or amiodarone.  Obviously this would also require another cardioversion.  We then decided to see how he did before proceeding.     Have not seen him about 3 times since his initial decision and he is actually doing the same.  He really has no specific interest and a more aggressive treatment plan is as he says he basely feels the same as they did before, does all the same things he would normally do and his repeat echocardiogram [place] function remain normal.  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  But he is feeling his time with other ways, he still works out regularly although his  weight loss, which has been pretty significant early in the year, appears to have stalled.    Blood pressure readings at home are excellent.      PAST MEDICAL HISTORY: He has a past medical history of Arrhythmia, Arthritis, Asthma without status asthmaticus, Atrial fibrillation, Blood transfusion without reported diagnosis (1997), Coronary artery disease (2011), Diabetes mellitus type II (2007), Hyperlipidemia, Hypertensive disorder, Kidney stone (2010), Myocardial infarction (2011), Skin cancer (1980'S), Sleep apnea, and Stented coronary artery (12/11/2010). He has a past surgical history that includes Joint replacement (12/15/2008); Cholecystectomy (12/16/1995); STEM CELL KNEE (12/15/2008); Coronary angioplasty with stent (11/14/2010); Cardiac surgery (1982, 1997); Colonoscopy (12/15/2005); and Coronary artery bypass graft (Oct 1982, Sept 1997).    ALLERGIES:   Allergies   Allergen Reactions    Atorvastatin Other (See Comments)     Muscle Pain       MEDICATIONS:   Current Outpatient Medications:     Acetaminophen (TYLENOL PO), Take by mouth as needed, Disp: , Rfl:     apixaban (Eliquis) 5 MG, TAKE 1 TABLET BY MOUTH EVERY 12  HOURS, Disp: 180 tablet, Rfl: 0    aspirin EC 81 MG EC tablet, Take 81 mg by mouth three times a week, Disp: , Rfl:     Calcium-Magnesium 100-50 MG Tab, Take by mouth daily, Disp: , Rfl:  cetirizine (ZYRTEC) 10 MG tablet, Take 10 mg by mouth as needed in the morning., Disp: , Rfl:     ezetimibe (ZETIA) 10 MG tablet, Take 1 tablet (10 mg) by mouth daily, Disp: 90 tablet, Rfl: 2    fenofibrate micronized (LOFIBRA) 134 MG capsule, Take 134 mg by mouth every morning before breakfast., Disp: , Rfl:     losartan-hydrochlorothiazide (HYZAAR) 100-12.5 MG per tablet, Take 1 tablet by mouth every morning., Disp: , Rfl:     metoprolol tartrate (LOPRESSOR) 25 MG tablet, Take 0.5 tablets (12.5 mg) by mouth 2 (two) times daily, Disp: 180 tablet, Rfl: 2    Potassium 99 MG Tab, Take 1 tablet by mouth  daily, Disp: , Rfl:     Pyridoxine HCl (VITAMIN B-6) 100 MG tablet, Take 100 mg by mouth daily., Disp: , Rfl:     rosuvastatin (CRESTOR) 40 MG tablet, Take 1 tablet (40 mg) by mouth daily, Disp: 90 tablet, Rfl: 2    spironolactone (ALDACTONE) 25 MG tablet, Take 25 mg by mouth daily, Disp: , Rfl:     Vitamin D3 (CHOLECALCIFEROL) 125 MCG (5000 UT) Tab tablet, Take 2,000 Units by mouth once a week Takes on Monday, Disp: , Rfl:     vitamins/minerals Tab, Take 1 tablet by mouth daily, Disp: , Rfl:      FAMILY HISTORY: family history includes CABG in his brother and father; Heart failure in his father and mother.    SOCIAL HISTORY: He reports that he has never smoked. He has never used smokeless tobacco. He reports that he does not drink alcohol and does not use drugs.    PHYSICAL EXAMINATION    There were no vitals taken for this visit.    General Appearance:  A well-appearing male in no acute distress.    Skin: Warm and dry to touch   Head: Normocephalic  Eyes: EOMS Intact   ENT: masked  Neck: JVP normal, no carotid bruit  Chest: Clear to auscultation bilaterally    Cardiovascular: Irregularly irregular, normal S1-S2, soft holosystolic murmur  Abdomen: No guarding  Extremities: Warm without pitting edema.   Neuro: Alert and oriented x3. No gross motor or sensory deficits noted, affect appropriate.          LABS:   Lab Results   Component Value Date    WBC 6.79 01/05/2021    HGB 14.6 01/05/2021    HCT 42.3 01/05/2021    PLT 261 01/05/2021     Lab Results   Component Value Date    GLU 174 (H) 07/01/2021    BUN 24.0 07/01/2021    CREAT 1.2 07/01/2021    NA 134 (L) 07/01/2021    K 4.3 07/01/2021    CL 103 07/01/2021    CO2 22 07/01/2021    AST 21 11/17/2010    ALT 26 11/17/2010     Lab Results   Component Value Date    MG 1.7 07/01/2021    TSH 3.54 07/01/2021     Echo 10/2021:  Conclusions: 1. Normal left ventricular size, wall motion and ejection               fraction of 57%.                  2. Compared to the prior  study on 10/23/2020, left ventricular               function remains preserved.      IMPRESSION:   Mr.  Fleagle is a 81 y.o. male with the following problems:     Persistent atrial fibrillation, new diagnosis as of 09/18/2020, rate controlled, and back in AF after Nov 2021 DCCV   CHADSVASc at least 5 for age, htn, dm, cad    Unclear duration, but seems like it have begun at the beginning of October 2021.  Successful cardioversion however ~21 by early recurrence of atrial fibrillation.  Remains in atrial fibrillation at this time, although not clearly symptomatic,  TTE  Nov 2021 EF, trace to mild AI, mildly dilated left atrium by volume index.  Coronary artery disease.  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  Subsequent NSTEMI in 2012, status post stenting to his native circumflex with residual total occlusions of two vein grafts that were too small to intervene on.  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  Has been medically managed since then.  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  Intolerance to Imdur (headache) and Ranexa (dizziness).  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  Hypertension,  controlled  Echocardiogram May 2021: EF 55 to 60%, grade 1 diastolic dysfunction.  Pedal edema, worse recently, suspect this may have been influenced little bit by the increase in his amlodipine dose.  Hypertensive heart disease.  Obstructive sleep apnea, on CPAP.  Hyperlipidemia, on recently increased dose of Crestor.  Last LDL July was around target at 75 but he is now running a little bit about that consistently, LDL now back up to 84.Marland Kitchen  HDL remains low chronically.  Diabetes mellitus.  Obesity, somewhat loosely back on the [employer] diet, has lost about 7 pounds since last visit.  He is now  supplementing with IP foods but he is not ketotic.  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  History of reactive airway disease.  Participation in [place] trial, now concluded.  S/p bilateral cataract surgery.      RECOMMENDATIONS:     Continue Eliquis 5 mg p.o. twice daily and other cardiac medicines  Continue rate control.  No plans for trying to reestablish sinus rhythm and states he is not symptomatic and this would probably involve Tikosyn or sotalol.  I mentioned he may want to talk to his primary care physician about Ozempic or [phone] as his weight loss the patient is stalled and he is still needing some additional treatment for his blood sugar.  Follow-up visits in 6 and 12 months.                                                 No orders of the defined types were placed in this encounter.        No orders of the defined types were placed in this encounter.        SIGNED:    Lelan Pons, MD          This note was generated by the Dragon speech recognition and may contain errors or omissions not intended by the user. Grammatical errors, random word insertions, deletions, pronoun errors, and incomplete sentences are occasional consequences of this technology due to software limitations. Not all errors are caught or corrected. If there are  questions or concerns about the content of this note or information contained within the body of this dictation, they should be addressed directly with the author for clarification.

## 2022-05-08 ENCOUNTER — Ambulatory Visit (INDEPENDENT_AMBULATORY_CARE_PROVIDER_SITE_OTHER): Payer: Medicare Other | Admitting: Cardiovascular Disease

## 2022-05-08 ENCOUNTER — Encounter (INDEPENDENT_AMBULATORY_CARE_PROVIDER_SITE_OTHER): Payer: Self-pay | Admitting: Cardiovascular Disease

## 2022-05-08 VITALS — BP 106/62 | HR 77 | Ht 65.0 in | Wt 260.0 lb

## 2022-05-08 DIAGNOSIS — I25810 Atherosclerosis of coronary artery bypass graft(s) without angina pectoris: Secondary | ICD-10-CM

## 2022-05-08 DIAGNOSIS — I25119 Atherosclerotic heart disease of native coronary artery with unspecified angina pectoris: Secondary | ICD-10-CM

## 2022-05-08 DIAGNOSIS — I4819 Other persistent atrial fibrillation: Secondary | ICD-10-CM

## 2022-06-25 ENCOUNTER — Institutional Professional Consult (permissible substitution) (INDEPENDENT_AMBULATORY_CARE_PROVIDER_SITE_OTHER): Payer: Medicare Other | Admitting: Cardiovascular Disease

## 2022-07-08 ENCOUNTER — Encounter (INDEPENDENT_AMBULATORY_CARE_PROVIDER_SITE_OTHER): Payer: Self-pay | Admitting: Cardiovascular Disease

## 2022-07-08 ENCOUNTER — Ambulatory Visit (INDEPENDENT_AMBULATORY_CARE_PROVIDER_SITE_OTHER): Payer: Medicare Other | Admitting: Cardiovascular Disease

## 2022-07-08 ENCOUNTER — Institutional Professional Consult (permissible substitution) (INDEPENDENT_AMBULATORY_CARE_PROVIDER_SITE_OTHER): Payer: Medicare Other | Admitting: Cardiovascular Disease

## 2022-07-08 DIAGNOSIS — I4819 Other persistent atrial fibrillation: Secondary | ICD-10-CM

## 2022-07-08 NOTE — Progress Notes (Signed)
[facility] ELECTROPHYSIOLOGY OFFICE PROGRESS NOTE    HRT [facility] OFFICE  [facility] [facility] OFFICE [facility]  [address]  [place] [address]  Dept: [phone]  Dept Fax: [phone]       Patient Name: Travis Hogan    Date of Visit:  July 08, 2022  Date of Birth: [birth year]  AGE: 81 y.o.  Medical Record #: [id number]    Primary Electrophysiologist: New EP Consult    Primary Cardiologist: Bruce Donath MD, [facility]    CHIEF COMPLAINT:  Persistent atrial fibrillation (EP Follow-up)    HISTORY OF PRESENT ILLNESS:    I saw the patient today in electrophysiology evaluation at the request of Dr. Bruce Donath regarding management of longstanding persistent atrial fibrillation.  The patient is an 81 year old male with a history of coronary artery disease with prior remote bypass surgery 1982 and repeat CABG in 1997 and subsequent PCI in 2012.  Comorbidities include chronic diastolic heart failure, hypertension, obstructive sleep apnea with CPAP compliance, and morbid obesity with a BMI of 43.  He was diagnosed with atrial fibrillation in October 2021 with rate control and underwent a cardioversion 1 month after oral anticoagulation.  He had reversion back to atrial fibrillation within a short timeframe and was seen by Dr. Ida Rogue in consultation in December 2021.  Rate versus rhythm control strategies were discussed and no change of therapy planned.  He is remained atrial fibrillation over the last 18 months since.  He reports be feeling well and overall has no symptoms of palpitations heart racing, or appreciable differences in dyspnea.  He continues to exercise on a regular basis and has no new limitations.  Given his age and comorbidities he is more active than expected.  He has been on oral anticoagulation without bleeding difficulty    PAST MEDICAL HISTORY: He has a past medical history of Arrhythmia, Arthritis, Asthma without status asthmaticus, Atrial fibrillation,  Blood transfusion without reported diagnosis (1997), Coronary artery disease (2011), Diabetes mellitus type II (2007), Hyperlipidemia, Hypertensive disorder, Kidney stone (2010), Myocardial infarction (2011), Skin cancer (1980'S), Sleep apnea, and Stented coronary artery (12/11/2010). He has a past surgical history that includes Joint replacement (12/15/2008); Cholecystectomy (12/16/1995); STEM CELL KNEE (12/15/2008); Coronary angioplasty with stent (11/14/2010); Cardiac surgery (1982, 1997); Colonoscopy (12/15/2005); and Coronary artery bypass graft (Oct 1982, Sept 1997).    ALLERGIES:   Allergies   Allergen Reactions    Atorvastatin Other (See Comments)     Muscle Pain     MEDICATIONS:   Current Outpatient Medications:     Acetaminophen (TYLENOL PO), Take by mouth as needed, Disp: , Rfl:     apixaban (Eliquis) 5 MG, TAKE 1 TABLET BY MOUTH EVERY 12  HOURS, Disp: 180 tablet, Rfl: 0    aspirin EC 81 MG EC tablet, Take 1 tablet (81 mg) by mouth three times a week, Disp: , Rfl:     Calcium-Magnesium 100-50 MG Tab, Take by mouth daily, Disp: , Rfl:     cetirizine (ZYRTEC) 10 MG tablet, Take 1 tablet (10 mg) by mouth as needed, Disp: , Rfl:     ezetimibe (ZETIA) 10 MG tablet, Take 1 tablet (10 mg) by mouth daily, Disp: 90 tablet, Rfl: 2    fenofibrate micronized (LOFIBRA) 134 MG capsule, Take 1 capsule (134 mg) by mouth every morning before breakfast, Disp: , Rfl:     losartan-hydrochlorothiazide (HYZAAR) 100-12.5 MG per tablet, Take 1 tablet by mouth every morning, Disp: , Rfl:  metoprolol tartrate (LOPRESSOR) 25 MG tablet, Take 0.5 tablets (12.5 mg) by mouth 2 (two) times daily, Disp: 180 tablet, Rfl: 2    Potassium 99 MG Tab, Take 1 tablet (99 mg) by mouth daily, Disp: , Rfl:     Pyridoxine HCl (VITAMIN B-6) 100 MG tablet, Take 1 tablet (100 mg) by mouth daily, Disp: , Rfl:     rosuvastatin (CRESTOR) 40 MG tablet, Take 1 tablet (40 mg) by mouth daily, Disp: 90 tablet, Rfl: 2    spironolactone (ALDACTONE) 25 MG  tablet, Take 1 tablet (25 mg) by mouth daily, Disp: , Rfl:     Vitamin D3 (CHOLECALCIFEROL) 125 MCG (5000 UT) Tab tablet, Take 2,000 Units by mouth once a week Takes on Monday, Disp: , Rfl:     vitamins/minerals Tab, Take 1 tablet by mouth daily, Disp: , Rfl:   Patient's current medications were reviewed. ONLY Cardiac medications were updated unless others were addressed in assessment and plan.  PHYSICAL EXAMINATION  Visit Vitals  BP 122/68 (BP Site: Left arm, Patient Position: Sitting, Cuff Size: Large)   Pulse 60   Ht 1.651 m (5\' 5" )   Wt 117 kg (258 lb)   BMI 42.93 kg/m      General Appearance:  A well-appearing male in no acute distress.    Neck: JVP normal, no carotid bruit, thyroid not enlarged   Chest: Clear to auscultation bilaterally with good air movement and respiratory effort and no wheezes, rales, or rhonchi   Cardiovascular: Regular rhythm, S1 normal, S2 normal, No S3 or S4. Apical impulse not displaced,. No murmur. No gallops or rubs detected   Extremities: Bilateral radial pulses 2+   Neuro: Alert and oriented x3.    ECG independently reviewed and interpreted by me: Thoracal EKG dating back through 2021 confirm atrial fibrillation    LABS:   Lab Results   Component Value Date    WBC 6.79 01/05/2021    HGB 14.6 01/05/2021    HCT 42.3 01/05/2021    PLT 261 01/05/2021     Lab Results   Component Value Date    GLU 174 (H) 07/01/2021    BUN 24.0 07/01/2021    CREAT 1.2 07/01/2021    NA 134 (L) 07/01/2021    K 4.3 07/01/2021    CL 103 07/01/2021    CO2 22 07/01/2021    CA 9.1 07/01/2021    PROT 6.9 11/17/2010    AST 21 11/17/2010    ALT 26 11/17/2010    BILITOTAL 0.6 11/17/2010     Lab Results   Component Value Date    MG 1.7 07/01/2021    TSH 3.54 07/01/2021     CrCl cannot be calculated (Patient's most recent lab result is older than the maximum 30 days allowed.).  I have personally reviewed and independently interpreted the above labwork  IMPRESSION:   Travis Hogan is a 81 y.o. male with the following  problems:    Longstanding persistent atrial fibrillation diagnosed in October 2021, cardioversion November 2021 with failure to maintain normal rhythm rate control strategy since that juncture  CHADS2 Vascor of 5 for age, coronary artery disease, diabetes, and hypertension on uninterrupted oral anticoagulation  Coronary artery disease with prior bypass surgery 1982, redo bypass in 1997, and subsequent PCI in 2012  Echocardiogram May 2021 with preserved left ventricular ejection fraction with diastolic dysfunction and no significant contributory valve disease  Obstructive sleep apnea on CPAP  Morbid obesity with a BMI of 43  RECOMMENDATIONS:    It was a pleasure to see patient electrophysiology evaluation.  He has been on longstanding persistent atrial fibrillation for nearly 2 years.  We do lengthy discussion regarding atrial fibrillation and its management considerations and I would advocate for lifelong systemic anticoagulation as tolerated.  We discussed the comparative data of rate versus rhythm control strategies and varied approaches at rhythm control including antiarrhythmic drug therapy as well as ablation.  He has been in longstanding persistent atrial fibrillation for nearly 2 years and I agree the window is closing for an opportunity at pursuing a rhythm control approach.  He is no reported symptoms on today's visit and overall has no complaints.  The efforts to maintain normal rhythm may be an uphill battle given the longstem persistent nature of his atrial fibrillation and comorbidities including his added weight.  I think at 51 given the fact that he is feeling well he would be better served with an ongoing rate control strategy.  We did discuss alternatives of amiodarone loading and cardioversion.  If amiodarone would be ineffective the think ablation would then be the only option which again may be more of an uphill battle.  All things considered I think we should stick with his current approach of  rate control and anticoagulation.  I discussed with Dr. Levy Sjogren and patient ok with plan moving forward.                                               No orders of the defined types were placed in this encounter.      No orders of the defined types were placed in this encounter.        SIGNED:    Thurnell Lose, MD         This note was generated by the Dragon speech recognition and may contain errors or omissions not intended by the user. Grammatical errors, random word insertions, deletions, pronoun errors, and incomplete sentences are occasional consequences of this technology due to software limitations. Not all errors are caught or corrected. If there are questions or concerns about the content of this note or information contained within the body of this dictation, they should be addressed directly with the author for clarification.

## 2022-08-16 ENCOUNTER — Other Ambulatory Visit (INDEPENDENT_AMBULATORY_CARE_PROVIDER_SITE_OTHER): Payer: Self-pay | Admitting: Cardiovascular Disease

## 2022-08-16 DIAGNOSIS — I2510 Atherosclerotic heart disease of native coronary artery without angina pectoris: Secondary | ICD-10-CM

## 2022-08-20 ENCOUNTER — Other Ambulatory Visit (INDEPENDENT_AMBULATORY_CARE_PROVIDER_SITE_OTHER): Payer: Self-pay

## 2022-08-20 DIAGNOSIS — I2510 Atherosclerotic heart disease of native coronary artery without angina pectoris: Secondary | ICD-10-CM

## 2022-08-20 MED ORDER — APIXABAN 5 MG PO TABS
5.0000 mg | ORAL_TABLET | Freq: Two times a day (BID) | ORAL | 1 refills | Status: DC
Start: 2022-08-20 — End: 2023-01-15

## 2022-10-30 ENCOUNTER — Ambulatory Visit
Admission: RE | Admit: 2022-10-30 | Discharge: 2022-10-30 | Disposition: A | Discharge status: Home / Self Care | Payer: Medicare Other | Source: Ambulatory Visit | Attending: Internal Medicine | Admitting: Internal Medicine

## 2022-10-30 DIAGNOSIS — E1165 Type 2 diabetes mellitus with hyperglycemia: Secondary | ICD-10-CM | POA: Insufficient documentation

## 2022-10-30 LAB — BASIC METABOLIC PANEL
Anion Gap: 10 (ref 5.0–15.0)
BUN: 27 mg/dL (ref 9.0–28.0)
CO2: 23 mEq/L (ref 17–29)
Calcium: 9.2 mg/dL (ref 7.9–10.2)
Chloride: 101 mEq/L (ref 99–111)
Creatinine: 1.2 mg/dL (ref 0.5–1.5)
Glucose: 166 mg/dL — ABNORMAL HIGH (ref 70–100)
Potassium: 4.4 mEq/L (ref 3.5–5.3)
Sodium: 134 mEq/L — ABNORMAL LOW (ref 135–145)
eGFR: 60 mL/min/{1.73_m2} (ref >=60)

## 2022-10-30 LAB — HEMOGLOBIN A1C
Average Estimated Glucose: 168.6 mg/dL
Hemoglobin A1C: 7.5 % — ABNORMAL HIGH (ref 4.6–5.6)

## 2022-10-30 LAB — HEMOLYSIS INDEX: Hemolysis Index: 9 Index (ref 0–24)

## 2022-11-10 ENCOUNTER — Ambulatory Visit (INDEPENDENT_AMBULATORY_CARE_PROVIDER_SITE_OTHER): Payer: Medicare Other

## 2022-11-10 DIAGNOSIS — I4819 Other persistent atrial fibrillation: Secondary | ICD-10-CM

## 2022-11-11 ENCOUNTER — Encounter (INDEPENDENT_AMBULATORY_CARE_PROVIDER_SITE_OTHER): Payer: Self-pay | Admitting: Cardiovascular Disease

## 2022-11-13 ENCOUNTER — Ambulatory Visit (INDEPENDENT_AMBULATORY_CARE_PROVIDER_SITE_OTHER): Payer: Medicare Other | Admitting: Physician Assistant

## 2022-11-13 ENCOUNTER — Encounter (INDEPENDENT_AMBULATORY_CARE_PROVIDER_SITE_OTHER): Payer: Self-pay | Admitting: Physician Assistant

## 2022-11-13 VITALS — BP 118/68 | HR 72 | Ht 65.0 in | Wt 250.6 lb

## 2022-11-13 DIAGNOSIS — I2510 Atherosclerotic heart disease of native coronary artery without angina pectoris: Secondary | ICD-10-CM

## 2022-11-13 DIAGNOSIS — I4821 Permanent atrial fibrillation: Secondary | ICD-10-CM

## 2022-11-13 DIAGNOSIS — Z6841 Body Mass Index (BMI) 40.0 and over, adult: Secondary | ICD-10-CM

## 2022-11-13 NOTE — Progress Notes (Signed)
[facility] [place] [facility] PROGRESS NOTE    [facility]  [facility]  [address]  [place] [id number]  Dept: [phone]  Dept Fax: [phone]       Patient Name: Travis Hogan,Travis Hogan    Date of Visit:  November 13, 2022  Date of Birth: 13-May-1941  AGE: 81 y.o.  Medical Record #: [id number]  Requesting Physician: Conception Omshomas A Feely, MD      CHIEF COMPLAINT: Atrial Fibrillation      HISTORY OF PRESENT ILLNESS:    He is a pleasant 81 y.o. male who presents today for f/u CAD, AF. Has dropped 10 lb after starting Jardiance. Feels well. 40 min on recumbent bicycle three times per week with stable stamina and no elicited chest discomfort or dyspnea out of proportion to level of activity. Denies syncope, lightheadedness, leg edema.   He saw Dr. Amado CoeFein for second opinion EP in Jul 2023 and rate ctrl approach was decided upon. He has remained on eliquis without bleeding problems.       PAST MEDICAL HISTORY: He has a past medical history of Arrhythmia, Arthritis, Asthma without status asthmaticus, Atrial fibrillation, Blood transfusion without reported diagnosis (1997), Coronary artery disease (2011), Diabetes mellitus type II (2007), Hyperlipidemia, Hypertensive disorder, Kidney stone (2010), Myocardial infarction (2011), Skin cancer (1980'S), Sleep apnea, and Stented coronary artery (12/11/2010). He has a past surgical history that includes Joint replacement (12/15/2008); Cholecystectomy (12/16/1995); STEM CELL KNEE (12/15/2008); Coronary angioplasty with stent (11/14/2010); Cardiac surgery (1982, 1997); Colonoscopy (12/15/2005); and Coronary artery bypass graft (Oct 1982, Sept 1997).    ALLERGIES:   Allergies   Allergen Reactions    Atorvastatin Other (See Comments)     Muscle Pain       MEDICATIONS:   Current Outpatient Medications:     Acetaminophen (TYLENOL PO), Take by mouth as needed, Disp: , Rfl:     apixaban (Eliquis) 5 MG, Take 1 tablet (5 mg) by  mouth every 12 (twelve) hours, Disp: 180 tablet, Rfl: 1    aspirin EC 81 MG EC tablet, Take 1 tablet (81 mg) by mouth three times a week, Disp: , Rfl:     Calcium-Magnesium 100-50 MG Tab, Take by mouth daily, Disp: , Rfl:     cetirizine (ZYRTEC) 10 MG tablet, Take 1 tablet (10 mg) by mouth as needed, Disp: , Rfl:     ezetimibe (ZETIA) 10 MG tablet, Take 1 tablet (10 mg) by mouth daily, Disp: 90 tablet, Rfl: 2    fenofibrate micronized (LOFIBRA) 134 MG capsule, Take 1 capsule (134 mg) by mouth every morning before breakfast, Disp: , Rfl:     Jardiance 10 MG tablet, Take 1 tablet (10 mg) by mouth daily, Disp: , Rfl:     losartan-hydrochlorothiazide (HYZAAR) 100-12.5 MG per tablet, Take 1 tablet by mouth every morning, Disp: , Rfl:     MANGANESE PO, Take by mouth 2 (two) times daily, Disp: , Rfl:     metoprolol tartrate (LOPRESSOR) 25 MG tablet, Take 0.5 tablets (12.5 mg) by mouth 2 (two) times daily, Disp: 180 tablet, Rfl: 2    Potassium 99 MG Tab, Take 1 tablet (99 mg) by mouth daily, Disp: , Rfl:     Pyridoxine HCl (VITAMIN B-6) 100 MG tablet, Take 1 tablet (100 mg) by mouth daily, Disp: , Rfl:     rosuvastatin (CRESTOR) 40 MG tablet, Take 1 tablet (40 mg) by mouth daily, Disp: 90 tablet, Rfl: 2  spironolactone (ALDACTONE) 25 MG tablet, Take 1 tablet (25 mg) by mouth daily, Disp: , Rfl:     Vitamin D3 (CHOLECALCIFEROL) 125 MCG (5000 UT) Tab tablet, Take 2,000 Units by mouth once a week Takes on Monday, Disp: , Rfl:     vitamins/minerals Tab, Take 1 tablet by mouth daily, Disp: , Rfl:      FAMILY HISTORY: family history includes CABG in his brother and father; Heart failure in his father and mother.    SOCIAL HISTORY: He reports that he has never smoked. He has never used smokeless tobacco. He reports that he does not drink alcohol and does not use drugs.    PHYSICAL EXAMINATION    Visit Vitals  BP 118/68 (BP Site: Left arm, Patient Position: Sitting, Cuff Size: Large)   Pulse 72   Ht 1.651 m (5\' 5" )   Wt 113.7 kg  (250 lb 9.6 oz)   BMI 41.70 kg/m       General Appearance:  A well-appearing male in no acute distress.    Skin: Warm and dry to touch   Head: Normocephalic  Eyes: EOMS Intact   ENT: masked  Neck: JVP normal,    Chest: Clear to auscultation bilaterally    Cardiovascular: Irregularly irregular, normal S1-S2, soft holosystolic murmur  Abdomen: No guarding  Extremities: Warm without pitting edema.   Neuro: Alert and oriented x3. No gross motor or sensory deficits noted, affect appropriate.             LABS:   Lab Results   Component Value Date    WBC 6.79 01/05/2021    HGB 14.6 01/05/2021    HCT 42.3 01/05/2021    PLT 261 01/05/2021     Lab Results   Component Value Date    GLU 166 (H) 10/30/2022    BUN 27.0 10/30/2022    CREAT 1.2 10/30/2022    NA 134 (L) 10/30/2022    K 4.4 10/30/2022    CL 101 10/30/2022    CO2 23 10/30/2022    AST 21 11/17/2010    ALT 26 11/17/2010     Lab Results   Component Value Date    MG 1.7 07/01/2021    TSH 3.54 07/01/2021    HGBA1C 7.5 (H) 10/30/2022     Lab Results   Component Value Date    CHOL 105 04/24/2022    TRIG 69 04/24/2022    HDL 32 (L) 04/24/2022    LDL 59 04/24/2022            IMPRESSION:   Travis Hogan is a 81 y.o. male with the following problems:     Persistent atrial fibrillation, at this point longstanding persistent atrial fibrillation, new diagnosis as of 09/18/2020.   CHADSVASc at least 5 for age, htn, dm, cad .  On Eliquis.Unclear duration, but seems like it have begun at the beginning of October 2021.  Successful cardioversion November 2021 (last known NSR) but with ER AF within 1 month.  Remains in atrial fibrillation at this time, although not clearly symptomatic,  TTE  Nov 2021 EF, trace to mild AI, mildly dilated left atrium by volume index.  Coronary artery disease.  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  Subsequent NSTEMI in 2012, status post stenting to his native circumflex with residual total occlusions of two vein grafts that were too small  to intervene on.  Subsequent abnormal nuclear stress test in 2015 and repeat left heart catheterization showing  new lesion in the RPDA that was not amenable to intervention.  Has been medically managed since then.  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  Intolerance to Imdur (headache) and Ranexa (dizziness).  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  Hypertension,  controlled  Echocardiogram May 2021: EF 55 to 60%, grade 1 diastolic dysfunction.  Pedal edema, worse recently, suspect this may have been influenced little bit by the increase in his amlodipine dose.  Hypertensive heart disease.  Obstructive sleep apnea, on CPAP.  Hyperlipidemia, on recently increased dose of Crestor.  Last LDL July was around target at 75 but he is now running a little bit about that consistently, LDL now back up to 84.Marland Kitchen  HDL remains low chronically.  Diabetes mellitus.  Obesity, somewhat loosely back on the Ideal Protein diet, has lost about 7 pounds since last visit.  He is now supplementing with IP foods but he is not ketotic.  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  History of reactive airway disease.  Participation in [place] trial, now concluded.  S/p bilateral cataract surgery.      RECOMMENDATIONS:     Continue Eliquis 5 mg p.o. twice daily and other cardiac medicines.    He already has follow up arranged with Dr Levy Sjogren Feb 2024   Would benefit wt loss. Has already lost 10 lb after jardiance and through other adjustments - frequently chooses fish when eating out, and has been exercising routinely with recumbent bike.   Call our office if symptoms or cardiac concerns ahead of next visit  Continue routine f/u PCP                                                  No orders of the defined types were placed in this encounter.      No orders of the defined types were placed in this encounter.      SIGNED:    Lewanda Rife, PA          This note was  generated by the Dragon speech recognition and may contain errors or omissions not intended by the user. Grammatical errors, random word insertions, deletions, pronoun errors, and incomplete sentences are occasional consequences of this technology due to software limitations. Not all errors are caught or corrected. If there are questions or concerns about the content of this note or information contained within the body of this dictation, they should be addressed directly with the author for clarification.

## 2023-01-15 ENCOUNTER — Encounter (INDEPENDENT_AMBULATORY_CARE_PROVIDER_SITE_OTHER): Payer: Self-pay | Admitting: Cardiovascular Disease

## 2023-01-15 ENCOUNTER — Ambulatory Visit (INDEPENDENT_AMBULATORY_CARE_PROVIDER_SITE_OTHER): Payer: Medicare Other | Admitting: Cardiovascular Disease

## 2023-01-15 DIAGNOSIS — I2510 Atherosclerotic heart disease of native coronary artery without angina pectoris: Secondary | ICD-10-CM

## 2023-01-15 MED ORDER — ROSUVASTATIN CALCIUM 40 MG PO TABS
40.0000 mg | ORAL_TABLET | Freq: Every day | ORAL | 2 refills | Status: DC
Start: 2023-01-15 — End: 2023-12-18

## 2023-01-15 MED ORDER — APIXABAN 5 MG PO TABS
5.0000 mg | ORAL_TABLET | Freq: Two times a day (BID) | ORAL | 1 refills | Status: DC
Start: 2023-01-15 — End: 2023-02-13

## 2023-01-15 MED ORDER — METOPROLOL TARTRATE 25 MG PO TABS
12.5000 mg | ORAL_TABLET | Freq: Two times a day (BID) | ORAL | 2 refills | Status: DC
Start: 2023-01-15 — End: 2023-02-13

## 2023-01-15 MED ORDER — SPIRONOLACTONE 25 MG PO TABS
25.0000 mg | ORAL_TABLET | Freq: Every day | ORAL | 3 refills | Status: DC
Start: 2023-01-15 — End: 2023-02-13

## 2023-01-15 NOTE — Progress Notes (Addendum)
[place] [facility] PROGRESS NOTE    HRT [facility] OFFICE  [place] [facility] OFFICE -CARDIOLOGY  [place] [address]  Dept: [phone]  Dept Fax: [phone]       Patient Name: Travis Hogan    Date of Visit:  January 15, 2023  Date of Birth: 1941/07/02  AGE: 82 y.o.  Medical Record #: [id number]  Requesting Physician: Drema Halon, MD      CHIEF COMPLAINT: Coronary artery disease involving coronary bypass graft of  and Follow-up      HISTORY OF PRESENT ILLNESS:    He is a pleasant 82 y.o. male who presents today for f/u CAD, AF. Has dropped 10 lb after starting Jardiance. Feels well. 40 min on recumbent bicycle three times per week with stable stamina and no elicited chest discomfort or dyspnea out of proportion to level of activity. Denies syncope, lightheadedness, leg edema.   He saw Dr. Claudie Leach for second opinion EP in Jul 2023 and rate ctrl approach was decided upon.  It seemed that he would likely need an ablation or potentially multiple ablations to maintain sinus rhythm at this point in his atrial fibrillation history.  Since he is so well-controlled and functional, it was decided that rate control be appropriate.  His echocardiogram in November showed LV function remains normal.  He has a trace amount of lower extremity edema and his main complaint is been that of severe bruising even with no trauma.    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  However he would often do train walks in [place], and it sounds like access to the railroads is being increasingly restricted.  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      PAST MEDICAL HISTORY: He has a past medical history of Arrhythmia, Arthritis, Asthma without status asthmaticus, Atrial fibrillation, Blood transfusion without reported diagnosis  (1997), Coronary artery disease (2011), Diabetes mellitus type II (2007), Hyperlipidemia, Hypertensive disorder, Kidney stone (2010), Myocardial infarction (2011), Skin cancer (1980'S), Sleep apnea, and Stented coronary artery (12/11/2010). He has a past surgical history that includes Joint replacement (12/15/2008); Cholecystectomy (12/16/1995); STEM CELL KNEE (12/15/2008); Coronary angioplasty with stent (11/14/2010); Cardiac surgery (1982, 1997); Colonoscopy (12/15/2005); and Coronary artery bypass graft (Oct 1982, Sept 1997).    ALLERGIES:   Allergies   Allergen Reactions    Atorvastatin Other (See Comments)     Muscle Pain       MEDICATIONS:   Current Outpatient Medications:     Acetaminophen (TYLENOL PO), Take by mouth as needed, Disp: , Rfl:     apixaban (Eliquis) 5 MG, Take 1 tablet (5 mg) by mouth every 12 (twelve) hours, Disp: 180 tablet, Rfl: 1    aspirin EC 81 MG EC tablet, Take 1 tablet (81 mg) by mouth three times a week, Disp: , Rfl:     Calcium-Magnesium 100-50 MG Tab, Take by mouth daily, Disp: , Rfl:     cetirizine (ZYRTEC) 10 MG tablet, Take 1 tablet (10 mg) by mouth as needed, Disp: , Rfl:     ezetimibe (ZETIA) 10 MG tablet, Take 1 tablet (10 mg) by mouth daily, Disp: 90 tablet, Rfl: 2    fenofibrate micronized (LOFIBRA) 134 MG capsule, Take 1 capsule (134 mg) by mouth every morning before breakfast, Disp: , Rfl:  Jardiance 10 MG tablet, Take 1 tablet (10 mg) by mouth daily, Disp: , Rfl:     losartan-hydrochlorothiazide (HYZAAR) 100-12.5 MG per tablet, Take 1 tablet by mouth every morning, Disp: , Rfl:     MANGANESE PO, Take by mouth 2 (two) times daily, Disp: , Rfl:     metoprolol tartrate (LOPRESSOR) 25 MG tablet, Take 0.5 tablets (12.5 mg) by mouth 2 (two) times daily, Disp: 180 tablet, Rfl: 2    Potassium 99 MG Tab, Take 1 tablet (99 mg) by mouth daily, Disp: , Rfl:     Pyridoxine HCl (VITAMIN B-6) 100 MG tablet, Take 1 tablet (100 mg) by mouth daily, Disp: , Rfl:     rosuvastatin (CRESTOR)  40 MG tablet, Take 1 tablet (40 mg) by mouth daily, Disp: 90 tablet, Rfl: 2    spironolactone (ALDACTONE) 25 MG tablet, Take 1 tablet (25 mg) by mouth daily, Disp: , Rfl:     Vitamin D3 (CHOLECALCIFEROL) 125 MCG (5000 UT) Tab tablet, Take 2,000 Units by mouth once a week Takes on Monday, Disp: , Rfl:     vitamins/minerals Tab, Take 1 tablet by mouth daily, Disp: , Rfl:      FAMILY HISTORY: family history includes CABG in his brother and father; Heart failure in his father and mother.    SOCIAL HISTORY: He reports that he has never smoked. He has never used smokeless tobacco. He reports that he does not drink alcohol and does not use drugs.    PHYSICAL EXAMINATION    Visit Vitals  BP 130/70 (BP Site: Left arm, Patient Position: Sitting, Cuff Size: Medium)   Pulse 76   Wt 109.3 kg (241 lb)   BMI 40.10 kg/m       General Appearance:  A well-appearing male in no acute distress.    Skin: Warm and dry to touch   Head: Normocephalic  Eyes: EOMS Intact   ENT: masked  Neck: JVP normal,    Chest: Clear to auscultation bilaterally    Cardiovascular: Irregularly irregular, normal Q2-V9, soft holosystolic murmur  Abdomen: No guarding  Extremities: Warm without pitting edema.   Neuro: Alert and oriented x3. No gross motor or sensory deficits noted, affect appropriate.      Echo 10/2022:  Conclusions: 1. Visually estimated ejection fraction is 60%.                  2. Moderate left atrial enlargement.                  3. Right ventricular systolic function is normal.                  4. There is no pulmonary hypertension.                  5. No significant valvular dysfunction.                  6. Compared to the November, 2022 study, no significant               change.    LABS:   Lab Results   Component Value Date    WBC 6.79 01/05/2021    HGB 14.6 01/05/2021    HCT 42.3 01/05/2021    PLT 261 01/05/2021     Lab Results   Component Value Date    GLU 166 (H) 10/30/2022    BUN 27.0 10/30/2022    CREAT 1.2 10/30/2022    NA 134 (  L)  10/30/2022    K 4.4 10/30/2022    CL 101 10/30/2022    CO2 23 10/30/2022    AST 21 11/17/2010    ALT 26 11/17/2010     Lab Results   Component Value Date    MG 1.7 07/01/2021    TSH 3.54 07/01/2021    HGBA1C 7.5 (H) 10/30/2022     Lab Results   Component Value Date    CHOL 105 04/24/2022    TRIG 69 04/24/2022    HDL 32 (L) 04/24/2022    LDL 59 04/24/2022            IMPRESSION:   Travis Hogan is a 82 y.o. male with the following problems:     Persistent atrial fibrillation, at this point longstanding persistent atrial fibrillation, new diagnosis as of 09/18/2020.   CHADSVASc at least 5 for age, htn, dm, cad .  On Eliquis.Unclear duration, but seems like it have begun at the beginning of October 2021.  Successful cardioversion November 2021 (last known NSR) but with ER AF within 1 month.  Remains in atrial fibrillation at this time, although not clearly symptomatic,  Most recent gram 11/10/2022: EF 60%, moderate left atrial enlargement, no pulmonary hypertension or significant valvular dysfunction.  Coronary artery disease.  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  Subsequent NSTEMI in 2012, status post stenting to his native circumflex with residual total occlusions of two vein grafts that were too small to intervene on.  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  Has been medically managed since then.  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  Intolerance to Imdur (headache) and Ranexa (dizziness).  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  Hypertension,  controlled  Echocardiogram May 2021: EF 55 to 86%, grade 1 diastolic dysfunction.  Pedal edema, worse recently, suspect this may have been influenced little bit by the increase in his amlodipine dose.  Hypertensive heart disease.  Obstructive sleep  apnea, on CPAP.  Hyperlipidemia, on recently increased dose of Crestor.  Last LDL July was around target at 31 but he is now running a little bit about that consistently, LDL now back up to 84.Marland Kitchen  HDL remains low chronically.  Diabetes mellitus.  Obesity, somewhat loosely back on the Ideal Protein diet, has lost about 7 pounds since last visit.  He is now supplementing with IP foods but he is not ketotic.  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  History of reactive airway disease.  Participation in [place] trial, now concluded.  S/p bilateral cataract surgery.      RECOMMENDATIONS:     Continue rate control strategy as his long-term management for atrial fibrillation.  Concur with recommendations from Dr. Juluis Pitch and explained at length to the patient.  Continue Eliquis 5 mg p.o. twice daily and other cardiac medicines.    Because of his severe bruising and his commitment to chronic Eliquis, reviewed the risk and benefits of ceasing antiplatelet agent and favored stopping at this point in time.  Call our office if symptoms or cardiac concerns ahead of next visit  Same meds for now, labs with PCP, follow-up in 6 months  Repeat echo November 2025.  No orders of the defined types were placed in this encounter.      No orders of the defined types were placed in this encounter.      SIGNED:    Tiffany Kocher, MD          This note was generated by the Dragon speech recognition and may contain errors or omissions not intended by the user. Grammatical errors, random word insertions, deletions, pronoun errors, and incomplete sentences are occasional consequences of this technology due to software limitations. Not all errors are caught or corrected. If there are questions or concerns about the content of this note or information contained within the body of this dictation, they should be addressed directly with the author for clarification.

## 2023-01-15 NOTE — Addendum Note (Signed)
Addended by: Tiffany Kocher on: 01/15/2023 04:26 PM     Modules accepted: Orders

## 2023-01-27 ENCOUNTER — Ambulatory Visit
Admission: RE | Admit: 2023-01-27 | Discharge: 2023-01-27 | Disposition: A | Discharge status: Home / Self Care | Payer: Medicare Other | Source: Ambulatory Visit | Attending: Internal Medicine | Admitting: Internal Medicine

## 2023-01-27 DIAGNOSIS — I10 Essential (primary) hypertension: Secondary | ICD-10-CM | POA: Insufficient documentation

## 2023-01-27 DIAGNOSIS — I2510 Atherosclerotic heart disease of native coronary artery without angina pectoris: Secondary | ICD-10-CM | POA: Insufficient documentation

## 2023-01-27 DIAGNOSIS — E559 Vitamin D deficiency, unspecified: Secondary | ICD-10-CM | POA: Insufficient documentation

## 2023-01-27 DIAGNOSIS — E1165 Type 2 diabetes mellitus with hyperglycemia: Secondary | ICD-10-CM | POA: Insufficient documentation

## 2023-01-27 DIAGNOSIS — I4891 Unspecified atrial fibrillation: Secondary | ICD-10-CM | POA: Insufficient documentation

## 2023-01-27 DIAGNOSIS — E782 Mixed hyperlipidemia: Secondary | ICD-10-CM | POA: Insufficient documentation

## 2023-01-27 LAB — COMPREHENSIVE METABOLIC PANEL
ALT: 27 U/L (ref 0–55)
AST (SGOT): 32 U/L (ref 5–41)
Albumin/Globulin Ratio: 1.5 (ref 0.9–2.2)
Albumin: 3.9 g/dL (ref 3.5–5.0)
Alkaline Phosphatase: 49 U/L (ref 37–117)
Anion Gap: 10 (ref 5.0–15.0)
BUN: 23 mg/dL (ref 9.0–28.0)
Bilirubin, Total: 1.4 mg/dL — ABNORMAL HIGH (ref 0.2–1.2)
CO2: 22 mEq/L (ref 17–29)
Calcium: 9.1 mg/dL (ref 7.9–10.2)
Chloride: 101 mEq/L (ref 99–111)
Creatinine: 1 mg/dL (ref 0.5–1.5)
Globulin: 2.6 g/dL (ref 2.0–3.6)
Glucose: 145 mg/dL — ABNORMAL HIGH (ref 70–100)
Potassium: 4.5 mEq/L (ref 3.5–5.3)
Protein, Total: 6.5 g/dL (ref 6.0–8.3)
Sodium: 133 mEq/L — ABNORMAL LOW (ref 135–145)
eGFR: >60 mL/min/{1.73_m2} (ref >=60)

## 2023-01-27 LAB — CBC AND DIFFERENTIAL
Absolute NRBC: 0 10*3/uL (ref 0.00–0.00)
Basophils Absolute Automated: 0.06 10*3/uL (ref 0.00–0.08)
Basophils Automated: 0.7 %
Eosinophils Absolute Automated: 0.08 10*3/uL (ref 0.00–0.44)
Eosinophils Automated: 1 %
Hematocrit: 47.2 % (ref 37.6–49.6)
Hgb: 15.5 g/dL (ref 12.5–17.1)
Immature Granulocytes Absolute: 0.02 10*3/uL (ref 0.00–0.07)
Immature Granulocytes: 0.2 %
Instrument Absolute Neutrophil Count: 5.65 10*3/uL (ref 1.10–6.33)
Lymphocytes Absolute Automated: 1.79 10*3/uL (ref 0.42–3.22)
Lymphocytes Automated: 21.6 %
MCH: 29.2 pg (ref 25.1–33.5)
MCHC: 32.8 g/dL (ref 31.5–35.8)
MCV: 89.1 fL (ref 78.0–96.0)
MPV: 9.5 fL (ref 8.9–12.5)
Monocytes Absolute Automated: 0.69 10*3/uL (ref 0.21–0.85)
Monocytes: 8.3 %
Neutrophils Absolute: 5.65 10*3/uL (ref 1.10–6.33)
Neutrophils: 68.2 %
Nucleated RBC: 0 /100 WBC (ref 0.0–0.0)
Platelets: 301 10*3/uL (ref 142–346)
RBC: 5.3 10*6/uL (ref 4.20–5.90)
RDW: 14 % (ref 11–15)
WBC: 8.29 10*3/uL (ref 3.10–9.50)

## 2023-01-27 LAB — LIPID PANEL
Cholesterol / HDL Ratio: 3.4 Index
Cholesterol: 113 mg/dL (ref 0–199)
HDL: 33 mg/dL — ABNORMAL LOW (ref 40–9999)
LDL Calculated: 63 mg/dL (ref 0–99)
Triglycerides: 85 mg/dL (ref 34–149)
VLDL Calculated: 17 mg/dL (ref 10–40)

## 2023-01-27 LAB — HEMOGLOBIN A1C
Average Estimated Glucose: 168.6 mg/dL
Hemoglobin A1C: 7.5 % — ABNORMAL HIGH (ref 4.6–5.6)

## 2023-01-27 LAB — URINE MICROALBUMIN, RANDOM
Urine Creatinine, Random: 116 mg/dL
Urine Microalbumin, Random: <5 ug/ml (ref 0.0–30.0)

## 2023-01-27 LAB — TSH: TSH: 2.22 u[IU]/mL (ref 0.35–4.94)

## 2023-01-27 LAB — VITAMIN D, 25 OH, TOTAL: Vitamin D, 25 OH, Total: 32 ng/mL (ref 30–100)

## 2023-01-27 LAB — HEMOLYSIS INDEX(SOFT): Hemolysis Index: 9 Index (ref 0–24)

## 2023-02-07 ENCOUNTER — Other Ambulatory Visit (INDEPENDENT_AMBULATORY_CARE_PROVIDER_SITE_OTHER): Payer: Self-pay | Admitting: Cardiovascular Disease

## 2023-02-07 DIAGNOSIS — I25119 Atherosclerotic heart disease of native coronary artery with unspecified angina pectoris: Secondary | ICD-10-CM

## 2023-02-07 DIAGNOSIS — I2510 Atherosclerotic heart disease of native coronary artery without angina pectoris: Secondary | ICD-10-CM

## 2023-02-13 ENCOUNTER — Other Ambulatory Visit (INDEPENDENT_AMBULATORY_CARE_PROVIDER_SITE_OTHER): Payer: Self-pay

## 2023-02-13 DIAGNOSIS — I25119 Atherosclerotic heart disease of native coronary artery with unspecified angina pectoris: Secondary | ICD-10-CM

## 2023-02-13 DIAGNOSIS — I2510 Atherosclerotic heart disease of native coronary artery without angina pectoris: Secondary | ICD-10-CM

## 2023-02-13 MED ORDER — SPIRONOLACTONE 25 MG PO TABS
25.0000 mg | ORAL_TABLET | Freq: Every day | ORAL | 3 refills | Status: DC
Start: 2023-02-13 — End: 2024-02-11

## 2023-02-13 MED ORDER — METOPROLOL TARTRATE 25 MG PO TABS
12.5000 mg | ORAL_TABLET | Freq: Two times a day (BID) | ORAL | 3 refills | Status: DC
Start: 2023-02-13 — End: 2024-02-11

## 2023-02-13 MED ORDER — EZETIMIBE 10 MG PO TABS
10.0000 mg | ORAL_TABLET | Freq: Every day | ORAL | 3 refills | Status: DC
Start: 2023-02-13 — End: 2024-02-11

## 2023-02-13 MED ORDER — APIXABAN 5 MG PO TABS
5.0000 mg | ORAL_TABLET | Freq: Two times a day (BID) | ORAL | 3 refills | Status: DC
Start: 2023-02-13 — End: 2024-02-11

## 2023-05-24 ENCOUNTER — Other Ambulatory Visit (INDEPENDENT_AMBULATORY_CARE_PROVIDER_SITE_OTHER): Payer: Self-pay | Admitting: Cardiovascular Disease

## 2023-05-24 DIAGNOSIS — I2510 Atherosclerotic heart disease of native coronary artery without angina pectoris: Secondary | ICD-10-CM

## 2023-05-25 ENCOUNTER — Encounter (INDEPENDENT_AMBULATORY_CARE_PROVIDER_SITE_OTHER): Payer: Self-pay

## 2023-08-03 ENCOUNTER — Ambulatory Visit (INDEPENDENT_AMBULATORY_CARE_PROVIDER_SITE_OTHER): Payer: Medicare Other | Admitting: Cardiovascular Disease

## 2023-08-03 ENCOUNTER — Encounter (INDEPENDENT_AMBULATORY_CARE_PROVIDER_SITE_OTHER): Payer: Self-pay | Admitting: Cardiovascular Disease

## 2023-08-03 DIAGNOSIS — Z6835 Body mass index (BMI) 35.0-35.9, adult: Secondary | ICD-10-CM | POA: Insufficient documentation

## 2023-08-03 DIAGNOSIS — I2510 Atherosclerotic heart disease of native coronary artery without angina pectoris: Secondary | ICD-10-CM

## 2023-08-03 LAB — ECG 12-LEAD
Q-T Interval: 388 ms
QRS Duration: 94 ms
QTC Calculation (Bezet): 424 ms
R Axis: -24 degrees
T Axis: 83 degrees
Ventricular Rate: 72 {beats}/min

## 2023-08-03 NOTE — Progress Notes (Addendum)
[facility] OFFICE PROGRESS NOTE    HRT [facility] OFFICE  [place] [facility] OFFICE -CARDIOLOGY  [address]  [place] [address]  Dept: [phone]  Dept Fax: [phone]       Patient Name: Travis Hogan    Date of Visit:  August 03, 2023  Date of Birth: 1941-05-04  AGE: 82 y.o.  Medical Record #: [id number]  Requesting Physician: Conception Oms, MD      CHIEF COMPLAINT: No chief complaint on file.      HISTORY OF PRESENT ILLNESS:    He is a pleasant 82 y.o. male who presents today for f/u CAD, AF. Has dropped 10 lb after starting Jardiance. Feels well. 40 min on recumbent bicycle three times per week with stable stamina and no elicited chest discomfort or dyspnea out of proportion to level of activity. Denies syncope, lightheadedness, leg edema.     He saw Dr. Amado Coe for second opinion EP in Jul 2023 and rate ctrl approach was decided upon.  It seemed that he would likely need an ablation or potentially multiple ablations to maintain sinus rhythm at this point in his atrial fibrillation history.  Since he is so well-controlled and functional, it was decided that rate control be appropriate.  His echocardiogram in November showed LV function remains normal.  He has a trace amount of lower extremity edema and his main complaint is been that of severe bruising even with no trauma.    Travis Hogan is doing about the same right now.  He remains active, exercises regular, little bit of edema no overt angina.  Has mild chronic exertional dyspnea.  He is about 3 years out from his last ischemic evaluation.  He is tolerating the atrial fibrillation well, and that he does not have any specific symptoms referable to this and he is keeping up his usual activities.  His main complaint is really that of the bruising from the Eliquis, but he understands its necessity.      PAST MEDICAL HISTORY: He has a past medical history of Arrhythmia, Arthritis, Asthma without status asthmaticus,  Atrial fibrillation, Blood transfusion without reported diagnosis (1997), Coronary artery disease (2011), Diabetes mellitus type II (2007), Hyperlipidemia, Hypertensive disorder, Kidney stone (2010), Myocardial infarction (2011), Skin cancer (1980'S), Sleep apnea, and Stented coronary artery (12/11/2010). He has a past surgical history that includes Joint replacement (12/15/2008); Cholecystectomy (12/16/1995); STEM CELL KNEE (12/15/2008); Coronary angioplasty with stent (11/14/2010); Cardiac surgery (1982, 1997); COLONOSCOPY, DIAGNOSTIC (SCREENING) (12/15/2005); Coronary artery bypass graft (Oct 1982, Sept 1997); and Cardioversion (10/2020).    ALLERGIES:   Allergies   Allergen Reactions    Atorvastatin Other (See Comments)     Muscle Pain       MEDICATIONS:   Current Outpatient Medications:     Acetaminophen (TYLENOL PO), Take by mouth as needed, Disp: , Rfl:     apixaban (Eliquis) 5 MG, Take 1 tablet (5 mg) by mouth every 12 (twelve) hours, Disp: 180 tablet, Rfl: 3    Calcium-Magnesium 100-50 MG Tab, Take by mouth daily, Disp: , Rfl:     cetirizine (ZYRTEC) 10 MG tablet, Take 1 tablet (10 mg) by mouth as needed, Disp: , Rfl:     ezetimibe (ZETIA) 10 MG tablet, Take 1 tablet (10 mg) by mouth daily, Disp: 90 tablet, Rfl: 3    fenofibrate micronized (LOFIBRA) 134 MG capsule, Take 1 capsule (134 mg) by mouth every morning before breakfast, Disp: , Rfl:     Jardiance  10 MG tablet, Take 1 tablet (10 mg) by mouth daily, Disp: , Rfl:     losartan-hydrochlorothiazide (HYZAAR) 100-12.5 MG per tablet, Take 1 tablet by mouth every morning, Disp: , Rfl:     MANGANESE PO, Take by mouth 2 (two) times daily, Disp: , Rfl:     metoprolol tartrate (LOPRESSOR) 25 MG tablet, Take 0.5 tablets (12.5 mg) by mouth 2 (two) times daily, Disp: 180 tablet, Rfl: 3    Potassium 99 MG Tab, Take 1 tablet (99 mg) by mouth daily, Disp: , Rfl:     Pyridoxine HCl (VITAMIN B-6) 100 MG tablet, Take 1 tablet (100 mg) by mouth daily, Disp: , Rfl:      rosuvastatin (CRESTOR) 40 MG tablet, Take 1 tablet (40 mg) by mouth daily, Disp: 90 tablet, Rfl: 2    spironolactone (ALDACTONE) 25 MG tablet, Take 1 tablet (25 mg) by mouth daily, Disp: 90 tablet, Rfl: 3    Vitamin D3 (CHOLECALCIFEROL) 125 MCG (5000 UT) Tab tablet, Take 2,000 Units by mouth once a week Takes on Monday, Disp: , Rfl:     vitamins/minerals Tab, Take 1 tablet by mouth daily, Disp: , Rfl:      FAMILY HISTORY: family history includes CABG in his brother and father; Heart failure in his father and mother.    SOCIAL HISTORY: He reports that he has never smoked. He has never used smokeless tobacco. He reports that he does not drink alcohol and does not use drugs.    PHYSICAL EXAMINATION    Visit Vitals  BP 110/68 (BP Site: Left arm, Patient Position: Sitting, Cuff Size: Large)   Pulse 72   Ht 1.651 m (5\' 5" )   Wt 108 kg (238 lb 3.2 oz)   BMI 39.64 kg/m         General Appearance:  A well-appearing male in no acute distress.    Skin: Warm and dry to touch   Head: Normocephalic  Eyes: EOMS Intact   ENT: masked  Neck: JVP normal,    Chest: Clear to auscultation bilaterally    Cardiovascular: Irregularly irregular, normal S1-S2, soft holosystolic murmur  Abdomen: No guarding  Extremities: Warm without pitting edema.   Neuro: Alert and oriented x3. No gross motor or sensory deficits noted, affect appropriate.      Echo 10/2022:  Conclusions: 1. Visually estimated ejection fraction is 60%.                  2. Moderate left atrial enlargement.                  3. Right ventricular systolic function is normal.                  4. There is no pulmonary hypertension.                  5. No significant valvular dysfunction.                  6. Compared to the November, 2022 study, no significant               change.    LABS:   Lab Results   Component Value Date    WBC 8.29 01/27/2023    HGB 15.5 01/27/2023    HCT 47.2 01/27/2023    PLT 301 01/27/2023     Lab Results   Component Value Date    GLU 145 (H) 01/27/2023     BUN 23.0  01/27/2023    CREAT 1.0 01/27/2023    NA 133 (L) 01/27/2023    K 4.5 01/27/2023    CL 101 01/27/2023    CO2 22 01/27/2023    AST 32 01/27/2023    ALT 27 01/27/2023     Lab Results   Component Value Date    MG 1.7 07/01/2021    TSH 2.22 01/27/2023    HGBA1C 7.5 (H) 01/27/2023     Lab Results   Component Value Date    CHOL 113 01/27/2023    TRIG 85 01/27/2023    HDL 33 (L) 01/27/2023    LDL 63 01/27/2023            IMPRESSION:   Mr. Barrowman is a 82 y.o. male with the following problems:     Persistent atrial fibrillation, at this point longstanding persistent atrial fibrillation, new diagnosis as of 09/18/2020.   CHADSVASc at least 5 for age, htn, dm, cad .  On Eliquis.Unclear duration, but seems like it have begun at the beginning of October 2021.  Successful cardioversion November 2021 (last known NSR) but with ER AF within 1 month.  Remains in atrial fibrillation at this time, although not clearly symptomatic,  Most recent gram 11/10/2022: EF 60%, moderate left atrial enlargement, no pulmonary hypertension or significant valvular dysfunction.  Coronary artery disease.  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  Subsequent NSTEMI in 2012, status post stenting to his native circumflex with residual total occlusions of two vein grafts that were too small to intervene on.  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  Has been medically managed since then.  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  Intolerance to Imdur (headache) and Ranexa (dizziness).  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  Hypertension,  controlled  Echocardiogram May 2021: EF 55 to 60%, grade 1 diastolic dysfunction.  Pedal edema, worse recently, suspect this may have been influenced little bit by the increase in his  amlodipine dose.  Hypertensive heart disease.  Obstructive sleep apnea, on CPAP.  Hyperlipidemia, on recently increased dose of Crestor.  Last LDL July was around target at 75 but he is now running a little bit about that consistently, LDL now back up to 84.Marland Kitchen  HDL remains low chronically.  Diabetes mellitus.  Obesity, somewhat loosely back on the Ide[employer] Protein diet, has lost about 7 pounds since last visit.  He is now supplementing with IP foods but he is not ketotic.  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  History of reactive airway disease.  Participation in [place] trial, now concluded.  S/p bilateral cataract surgery.      RECOMMENDATIONS:     Continue rate control strategy as his long-term management for atrial fibrillation.  Concur with recommendations from Dr. Marlou Starks and explained at length to the patient.  Continue Eliquis 5 mg p.o. twice daily and other cardiac medicines.    Because of his severe bruising and his commitment to chronic Eliquis, reviewed the risk and benefits of ceasing antiplatelet agent and favored stopping at this point in time.  Lexiscan PET CT scan ordered for fall 2024.  History of CAD, prior CABG, elevated BMI and CKD.  Chronic exertional symptoms of shortness of breath.  Echo in 1 year, reassess left atrial size, mitral valve function,  LV function.  If stable follow-up visits in 6 and 12 months and after that, annually if stable.  Always good seeing Travis Hogan back in the office.  He machine might try to send me another copy of his DVD, which ended up with one of our technicians.                                                 Orders Placed This Encounter   Procedures    Stress MPI (PETCT)    ECG 12 lead (Normal)    Echo 2D Complete    APP Office Visit (HRT [place])    Office Visit (HRT [place])       No orders of the defined types were placed in this encounter.      SIGNED:    Lelan Pons, MD          This note was generated by the Dragon speech recognition and may contain errors or omissions not  intended by the user. Grammatical errors, random word insertions, deletions, pronoun errors, and incomplete sentences are occasional consequences of this technology due to software limitations. Not all errors are caught or corrected. If there are questions or concerns about the content of this note or information contained within the body of this dictation, they should be addressed directly with the author for clarification.

## 2023-08-26 ENCOUNTER — Other Ambulatory Visit (INDEPENDENT_AMBULATORY_CARE_PROVIDER_SITE_OTHER): Payer: Self-pay | Admitting: Cardiovascular Disease

## 2023-08-26 DIAGNOSIS — I2510 Atherosclerotic heart disease of native coronary artery without angina pectoris: Secondary | ICD-10-CM

## 2023-08-27 ENCOUNTER — Ambulatory Visit (INDEPENDENT_AMBULATORY_CARE_PROVIDER_SITE_OTHER): Payer: Medicare Other

## 2023-08-27 DIAGNOSIS — I2510 Atherosclerotic heart disease of native coronary artery without angina pectoris: Secondary | ICD-10-CM

## 2023-08-27 MED ORDER — CARDIOGEN-82 IV SOLR
24.8400 | Freq: Once | INTRAVENOUS | Status: AC | PRN
Start: 2023-08-27 — End: 2023-08-27
  Administered 2023-08-27: 24.84 via INTRAVENOUS

## 2023-08-27 MED ORDER — REGADENOSON 0.4 MG/5ML IV SOLN
0.4000 mg | Freq: Once | INTRAVENOUS | Status: AC | PRN
Start: 2023-08-27 — End: 2023-08-27
  Administered 2023-08-27: 0.4 mg via INTRAVENOUS

## 2023-08-27 MED ORDER — CARDIOGEN-82 IV SOLR
25.0100 | Freq: Once | INTRAVENOUS | Status: AC | PRN
Start: 2023-08-27 — End: 2023-08-27
  Administered 2023-08-27: 25.01 via INTRAVENOUS

## 2023-08-29 ENCOUNTER — Encounter (INDEPENDENT_AMBULATORY_CARE_PROVIDER_SITE_OTHER): Payer: Self-pay | Admitting: Residents

## 2023-08-29 LAB — PETCT STRESS MPI: Stress LV Ejection Fraction: 61

## 2023-09-03 ENCOUNTER — Encounter (INDEPENDENT_AMBULATORY_CARE_PROVIDER_SITE_OTHER): Payer: Self-pay | Admitting: Cardiovascular Disease

## 2023-11-10 ENCOUNTER — Ambulatory Visit: Payer: Medicare Other | Attending: Internal Medicine

## 2023-11-10 DIAGNOSIS — E1165 Type 2 diabetes mellitus with hyperglycemia: Secondary | ICD-10-CM | POA: Insufficient documentation

## 2023-11-10 DIAGNOSIS — I10 Essential (primary) hypertension: Secondary | ICD-10-CM | POA: Insufficient documentation

## 2023-11-10 DIAGNOSIS — E1129 Type 2 diabetes mellitus with other diabetic kidney complication: Secondary | ICD-10-CM | POA: Insufficient documentation

## 2023-11-10 DIAGNOSIS — I480 Paroxysmal atrial fibrillation: Secondary | ICD-10-CM | POA: Insufficient documentation

## 2023-11-10 DIAGNOSIS — I2510 Atherosclerotic heart disease of native coronary artery without angina pectoris: Secondary | ICD-10-CM | POA: Insufficient documentation

## 2023-11-10 DIAGNOSIS — E7800 Pure hypercholesterolemia, unspecified: Secondary | ICD-10-CM | POA: Insufficient documentation

## 2023-11-10 DIAGNOSIS — E559 Vitamin D deficiency, unspecified: Secondary | ICD-10-CM | POA: Insufficient documentation

## 2023-11-10 LAB — LIPID PANEL
Cholesterol / HDL Ratio: 3 {index}
Cholesterol: 103 mg/dL (ref <=199)
HDL: 34 mg/dL — ABNORMAL LOW (ref >=40)
LDL Calculated: 56 mg/dL (ref 0–99)
Triglycerides: 66 mg/dL (ref 34–149)
VLDL Calculated: 13 mg/dL (ref 10–40)

## 2023-11-10 LAB — COMPREHENSIVE METABOLIC PANEL
ALT: 30 U/L (ref <=55)
AST (SGOT): 39 U/L (ref <=41)
Albumin/Globulin Ratio: 1.4 (ref 0.9–2.2)
Albumin: 3.9 g/dL (ref 3.5–5.0)
Alkaline Phosphatase: 54 U/L (ref 37–117)
Anion Gap: 8 (ref 5.0–15.0)
BUN: 21 mg/dL (ref 9–28)
Bilirubin, Total: 1.3 mg/dL — ABNORMAL HIGH (ref 0.2–1.2)
CO2: 23 meq/L (ref 17–29)
Calcium: 9 mg/dL (ref 7.9–10.2)
Chloride: 104 meq/L (ref 99–111)
Creatinine: 1 mg/dL (ref 0.5–1.5)
GFR: >60 mL/min/{1.73_m2} (ref >=60.0)
Globulin: 2.8 g/dL (ref 2.0–3.6)
Glucose: 158 mg/dL — ABNORMAL HIGH (ref 70–100)
Hemolysis Index: 12 {index}
Potassium: 4.3 meq/L (ref 3.5–5.3)
Protein, Total: 6.7 g/dL (ref 6.0–8.3)
Sodium: 135 meq/L (ref 135–145)

## 2023-11-10 LAB — URINE MICROALBUMIN, RANDOM
Urine Creatinine: 162 mg/dL
Urine Microalbumin/Creatinine Ratio: 7 ug/mg (ref <30)
Urine Microalbumin: 12 ug/mL (ref 0.0–30.0)

## 2023-11-10 LAB — LAB USE ONLY - CBC WITH DIFFERENTIAL
Absolute Basophils: 0.06 10*3/uL (ref 0.00–0.08)
Absolute Eosinophils: 0.11 10*3/uL (ref 0.00–0.44)
Absolute Immature Granulocytes: 0.02 10*3/uL (ref 0.00–0.07)
Absolute Lymphocytes: 2.23 10*3/uL (ref 0.42–3.22)
Absolute Monocytes: 0.63 10*3/uL (ref 0.21–0.85)
Absolute Neutrophils: 4.14 10*3/uL (ref 1.10–6.33)
Absolute nRBC: 0 10*3/uL (ref <=0.00)
Basophils %: 0.8 %
Eosinophils %: 1.5 %
Hematocrit: 47.9 % (ref 37.6–49.6)
Hemoglobin: 16.3 g/dL (ref 12.5–17.1)
Immature Granulocytes %: 0.3 %
Lymphocytes %: 31 %
MCH: 30.6 pg (ref 25.1–33.5)
MCHC: 34 g/dL (ref 31.5–35.8)
MCV: 90 fL (ref 78.0–96.0)
MPV: 9.3 fL (ref 8.9–12.5)
Monocytes %: 8.8 %
Neutrophils %: 57.6 %
Platelet Count: 272 10*3/uL (ref 142–346)
Preliminary Absolute Neutrophil Count: 4.14 10*3/uL (ref 1.10–6.33)
RBC: 5.32 10*6/uL (ref 4.20–5.90)
RDW: 13 % (ref 11–15)
WBC: 7.19 10*3/uL (ref 3.10–9.50)
nRBC %: 0 /100{WBCs} (ref <=0.0)

## 2023-11-10 LAB — VITAMIN D, 25 OH, TOTAL: Vitamin D 25-OH, Total: 28 ng/mL — ABNORMAL LOW (ref 30–100)

## 2023-11-10 LAB — HEMOGLOBIN A1C
Average Estimated Glucose: 171.4 mg/dL
Hemoglobin A1C: 7.6 % — ABNORMAL HIGH (ref 4.6–5.6)

## 2023-12-18 ENCOUNTER — Other Ambulatory Visit (INDEPENDENT_AMBULATORY_CARE_PROVIDER_SITE_OTHER): Payer: Self-pay | Admitting: Cardiovascular Disease

## 2023-12-18 MED ORDER — ROSUVASTATIN CALCIUM 40 MG PO TABS
40.0000 mg | ORAL_TABLET | Freq: Every day | ORAL | 3 refills | Status: DC
Start: 2023-12-18 — End: 2024-10-13

## 2024-02-04 ENCOUNTER — Encounter (INDEPENDENT_AMBULATORY_CARE_PROVIDER_SITE_OTHER): Payer: Self-pay | Admitting: Internal Medicine

## 2024-02-04 ENCOUNTER — Ambulatory Visit (INDEPENDENT_AMBULATORY_CARE_PROVIDER_SITE_OTHER): Payer: Medicare Other | Admitting: Radiology

## 2024-02-04 DIAGNOSIS — I2510 Atherosclerotic heart disease of native coronary artery without angina pectoris: Secondary | ICD-10-CM

## 2024-02-04 LAB — ECHO ADULT TTE COMPLETE
AV Area (Cont Eq VTI): 2.3449
AV Mean Gradient: 2
AV Peak Velocity: 1
Ao Root Diameter (2D): 3.7
BP Mod LV Ejection Fraction: 59
IVS Diastolic Thickness (2D): 1.3
LA Dimension (2D): 5.1
LA Volume Index (BP A-L): 55.5802
LVID diastole (2D): 4.2
LVID systole (2D): 2.8
MV E/A: 3.3333
Mitral Valve Findings: NORMAL
Prox Ascending Aorta Diameter: 4.4
Pulmonary Valve Findings: NORMAL
RV Basal Diastolic Dimension: 3.3
Tricuspid Valve Findings: NORMAL

## 2024-02-11 ENCOUNTER — Ambulatory Visit (INDEPENDENT_AMBULATORY_CARE_PROVIDER_SITE_OTHER): Payer: Medicare Other | Admitting: General Practice

## 2024-02-11 ENCOUNTER — Encounter (INDEPENDENT_AMBULATORY_CARE_PROVIDER_SITE_OTHER): Payer: Self-pay | Admitting: General Practice

## 2024-02-11 VITALS — BP 124/74 | HR 66 | Wt 229.4 lb

## 2024-02-11 DIAGNOSIS — I10 Essential (primary) hypertension: Secondary | ICD-10-CM

## 2024-02-11 DIAGNOSIS — E782 Mixed hyperlipidemia: Secondary | ICD-10-CM

## 2024-02-11 DIAGNOSIS — I2510 Atherosclerotic heart disease of native coronary artery without angina pectoris: Secondary | ICD-10-CM

## 2024-02-11 DIAGNOSIS — I25119 Atherosclerotic heart disease of native coronary artery with unspecified angina pectoris: Secondary | ICD-10-CM

## 2024-02-11 MED ORDER — METOPROLOL TARTRATE 25 MG PO TABS
12.5000 mg | ORAL_TABLET | Freq: Two times a day (BID) | ORAL | 3 refills | Status: DC
Start: 2024-02-11 — End: 2024-10-13

## 2024-02-11 MED ORDER — APIXABAN 5 MG PO TABS
5.0000 mg | ORAL_TABLET | Freq: Two times a day (BID) | ORAL | 3 refills | Status: AC
Start: 2024-02-11 — End: ?

## 2024-02-11 MED ORDER — LOSARTAN POTASSIUM 100 MG PO TABS
100.0000 mg | ORAL_TABLET | Freq: Every day | ORAL | 3 refills | Status: DC
Start: 2024-02-11 — End: 2024-10-13

## 2024-02-11 MED ORDER — SPIRONOLACTONE 25 MG PO TABS
25.0000 mg | ORAL_TABLET | Freq: Every day | ORAL | 3 refills | Status: AC
Start: 2024-02-11 — End: ?

## 2024-02-11 MED ORDER — EZETIMIBE 10 MG PO TABS: 10.0000 mg | ORAL_TABLET | Freq: Every day | ORAL | 3 refills | Status: DC

## 2024-02-11 NOTE — Progress Notes (Signed)
 [facility]  HEART CARDIOLOGY OFFICE PROGRESS NOTE    HRT [facility] OFFICE  [place]  HEART [facility] OFFICE -CARDIOLOGY  [address]  [place] [address]  Dept: [phone]  Dept Fax: [phone]       Patient Name: Travis Hogan    Date of Visit:  February 11, 2024  Date of Birth: [birth year]  AGE: 83 y.o.  Medical Record #: [id number]  Requesting Physician: Debby DELENA Braun, MD      CHIEF COMPLAINT: Coronary artery disease involving native heart without angi and Follow-up      HISTORY OF PRESENT ILLNESS:    He is a 83 y.o. male who presents today for follow-up. He has a history of CAD with four-vessel CABG in 1982 and redo in 1997, PCI to LCx 2012, permanent A-fib, hypertension, hyperlipidemia, diabetes, OSA, reactive airway disease.  Reports weight loss with ideal protein program.  In the setting started noticing BPs in the 90s but denies any lightheadedness or dizziness.  He had a coronary PET/CT on 08/27/2023 showing medium size perfusion abnormality in lateral wall but globally preserved MBFR.  He also had repeat echo 02/04/2024 showing EF 59%, no significant valve abnormality, ascending aorta 4.4 cm.  These are similar findings compared to 2023 study.  He is fairly active and denies any chest pain or shortness of breath with exertion.    Reviewed lab work from November 2024 showing cholesterol levels within goal.      PAST MEDICAL HISTORY: He has a past medical history of Arrhythmia, Arthritis, Asthma without status asthmaticus, Atrial fibrillation, Blood transfusion without reported diagnosis (1997), Coronary artery disease (2011), Diabetes mellitus type II (2007), Hyperlipidemia, Hypertensive disorder, Kidney stone (2010), Myocardial infarction (2011), Skin cancer (1980'S), Sleep apnea, and Stented coronary artery (12/11/2010). He has a past surgical history that includes Joint replacement (12/15/2008); Cholecystectomy (12/16/1995); STEM CELL KNEE (12/15/2008); Coronary angioplasty with  stent (11/14/2010); Cardiac surgery (1982, 1997); COLONOSCOPY, DIAGNOSTIC (SCREENING) (12/15/2005); Coronary artery bypass graft (Oct 1982, Sept 1997); and Cardioversion (10/2020).    ALLERGIES: Allergies[1]    Current Outpatient Medications   Medication Instructions    Acetaminophen  (TYLENOL  PO) As needed    apixaban  (ELIQUIS ) 5 mg, Oral, Every 12 hours    Calcium -Magnesium 100-50 MG Tab Daily    cetirizine (ZYRTEC) 10 mg, As needed    ezetimibe  (ZETIA ) 10 mg, Oral, Daily    fenofibrate micronized (LOFIBRA) 134 mg, Every morning before breakfast    Jardiance 10 mg, Daily    losartan  (COZAAR ) 100 mg, Oral, Daily    MANGANESE PO 2 times daily    metoprolol  tartrate (LOPRESSOR ) 12.5 mg, Oral, 2 times daily    Potassium 99 MG Tab 1 tablet, Daily    rosuvastatin  (CRESTOR ) 40 mg, Oral, Daily    spironolactone  (ALDACTONE ) 25 mg, Oral, Daily    vitamin B-6 100 mg, Daily    Vitamin D3 (CHOLECALCIFEROL) 2,000 Units, weekly    vitamins/minerals Tab 1 tablet, Daily       FAMILY HISTORY: family history includes CABG in his brother and father; Heart failure in his father and mother.    SOCIAL HISTORY: He reports that he has never smoked. He has never used smokeless tobacco. He reports that he does not drink alcohol and does not use drugs.    PHYSICAL EXAMINATION    Visit Vitals  BP 124/74 (BP Site: Left arm, Patient Position: Sitting, Cuff Size: Large)   Pulse 66   Wt 104.1 kg (229 lb 6.4 oz)   BMI  38.17 kg/m       Constitutional: Cooperative, alert, no acute distress.  Neck:  JVP normal.  Chest: Good respiratory effort.  Cardiac: Regular rate and rhythm, normal S1 and S2; no S3 or S4, no murmurs, no rubs, no gallops.  Pulmonary: Clear to auscultation bilaterally, no wheezing, no rhonchi, no rales.  Extremities: Warm. no edema.        LABS:   Lab Results   Component Value Date    WBC 7.19 11/10/2023    HGB 16.3 11/10/2023    HCT 47.9 11/10/2023    PLT 272 11/10/2023     Lab Results   Component Value Date    GLU 158 (H)  11/10/2023    BUN 21 11/10/2023    CREAT 1.0 11/10/2023    NA 135 11/10/2023    K 4.3 11/10/2023    CL 104 11/10/2023    CO2 23 11/10/2023    AST 39 11/10/2023    ALT 30 11/10/2023     Lab Results   Component Value Date    MG 1.7 07/01/2021    TSH 2.22 01/27/2023    HGBA1C 7.6 (H) 11/10/2023     Lab Results   Component Value Date    CHOL 103 11/10/2023    TRIG 66 11/10/2023    HDL 34 (L) 11/10/2023    LDL 56 11/10/2023         IMPRESSION:   Travis Hogan is a 83 y.o. male with the following problems:    CAD with four-vessel CABG in 1982 and redo in 1997.  No anginal symptoms  NSTEMI with PCI to LCx 2012.  Residual CTO of 2 vein grafts (medically managed)  Coronary PET/CT 08/27/2023: medium size perfusion abnormality in lateral wall but globally preserved MBFR  Intolerant to Imdur and Ranexa due to headache and dizziness  Permanent A-fib, on rhythm control with metoprolol   CHA2DS2-VASc score of 5, on Eliquis   History of bradycardia with higher dose metoprolol   Hypertension, now with tendency towards hypotension with weight loss  Hyperlipidemia, well-controlled  Diabetes,   OSA on CPAP  Reactive airway disease.  Echo 02/04/2024 EF 59%, no significant valve abnormality, ascending aorta 4.4 cm.  BMI 38, recent weight loss with ideal protein program      RECOMMENDATIONS:    Discontinue losartan /HCTZ and start losartan  100 mg daily due to tendency towards hypotension with weight loss.  If he has persistent BP less than 100 mmHg then can decrease losartan  to 50 mg daily.  Continue remainder of his medications without change.  Follow-up with Dr. Ever at the end of the year.                                                     Orders Placed This Encounter   Procedures    Office Visit (HRT [place])       Orders Placed This Encounter   Medications    apixaban  (Eliquis ) 5 MG     Sig: Take 1 tablet (5 mg) by mouth every 12 (twelve) hours     Dispense:  180 tablet     Refill:  3    ezetimibe  (ZETIA ) 10 MG tablet     Sig: Take 1 tablet (10  mg) by mouth daily     Dispense:  90 tablet     Refill:  3    metoprolol  tartrate (LOPRESSOR ) 25 MG tablet     Sig: Take 0.5 tablets (12.5 mg) by mouth 2 (two) times daily     Dispense:  180 tablet     Refill:  3    spironolactone  (ALDACTONE ) 25 MG tablet     Sig: Take 1 tablet (25 mg) by mouth daily     Dispense:  90 tablet     Refill:  3    losartan  (COZAAR ) 100 MG tablet     Sig: Take 1 tablet (100 mg) by mouth daily     Dispense:  90 tablet     Refill:  3       SIGNED:    Aureliano LOISE Lipps, FNP          This note was generated by the Dragon speech recognition and may contain errors or omissions not intended by the user. Grammatical errors, random word insertions, deletions, pronoun errors, and incomplete sentences are occasional consequences of this technology due to software limitations. Not all errors are caught or corrected. If there are questions or concerns about the content of this note or information contained within the body of this dictation, they should be addressed directly with the author for clarification.       [1]   Allergies  Allergen Reactions    Atorvastatin Other (See Comments)     Muscle Pain

## 2024-05-02 ENCOUNTER — Ambulatory Visit: Attending: Internal Medicine

## 2024-05-02 DIAGNOSIS — I10 Essential (primary) hypertension: Secondary | ICD-10-CM | POA: Insufficient documentation

## 2024-05-02 DIAGNOSIS — E559 Vitamin D deficiency, unspecified: Secondary | ICD-10-CM | POA: Insufficient documentation

## 2024-05-02 DIAGNOSIS — E782 Mixed hyperlipidemia: Secondary | ICD-10-CM | POA: Insufficient documentation

## 2024-05-02 DIAGNOSIS — E1100 Type 2 diabetes mellitus with hyperosmolarity without nonketotic hyperglycemic-hyperosmolar coma (NKHHC): Secondary | ICD-10-CM | POA: Insufficient documentation

## 2024-05-02 DIAGNOSIS — I4891 Unspecified atrial fibrillation: Secondary | ICD-10-CM | POA: Insufficient documentation

## 2024-05-02 DIAGNOSIS — I2510 Atherosclerotic heart disease of native coronary artery without angina pectoris: Secondary | ICD-10-CM | POA: Insufficient documentation

## 2024-05-02 LAB — COMPREHENSIVE METABOLIC PANEL
ALT: 22 U/L (ref <=55)
AST (SGOT): 32 U/L (ref <=41)
Albumin/Globulin Ratio: 1.3 (ref 0.9–2.2)
Albumin: 3.7 g/dL (ref 3.5–5.0)
Alkaline Phosphatase: 55 U/L (ref 37–117)
Anion Gap: 12 (ref 5.0–15.0)
BUN: 18 mg/dL (ref 9–28)
Bilirubin, Total: 1.2 mg/dL (ref 0.2–1.2)
CO2: 19 meq/L (ref 17–29)
Calcium: 8.9 mg/dL (ref 7.9–10.2)
Chloride: 106 meq/L (ref 99–111)
Creatinine: 1 mg/dL (ref 0.5–1.5)
GFR: >60 mL/min/1.73 m2 (ref >=60.0)
Globulin: 2.8 g/dL (ref 2.0–3.6)
Glucose: 176 mg/dL — ABNORMAL HIGH (ref 70–100)
Potassium: 4.2 meq/L (ref 3.5–5.3)
Protein, Total: 6.5 g/dL (ref 6.0–8.3)
Sodium: 137 meq/L (ref 135–145)

## 2024-05-02 LAB — LAB USE ONLY - CBC WITH DIFFERENTIAL
Absolute Basophils: 0.07 x10 3/uL (ref 0.00–0.08)
Absolute Eosinophils: 0.15 x10 3/uL (ref 0.00–0.44)
Absolute Immature Granulocytes: 0.01 x10 3/uL (ref 0.00–0.07)
Absolute Lymphocytes: 2.34 x10 3/uL (ref 0.42–3.22)
Absolute Monocytes: 0.66 x10 3/uL (ref 0.21–0.85)
Absolute Neutrophils: 3.38 x10 3/uL (ref 1.10–6.33)
Absolute nRBC: 0 x10 3/uL (ref <=0.00)
Basophils %: 1.1 %
Eosinophils %: 2.3 %
Hematocrit: 48.7 % (ref 37.6–49.6)
Hemoglobin: 16.3 g/dL (ref 12.5–17.1)
Immature Granulocytes %: 0.2 %
Lymphocytes %: 35.4 %
MCH: 31.1 pg (ref 25.1–33.5)
MCHC: 33.5 g/dL (ref 31.5–35.8)
MCV: 92.9 fL (ref 78.0–96.0)
MPV: 9.4 fL (ref 8.9–12.5)
Monocytes %: 10 %
Neutrophils %: 51 %
Platelet Count: 253 x10 3/uL (ref 142–346)
Preliminary Absolute Neutrophil Count: 3.38 x10 3/uL (ref 1.10–6.33)
RBC: 5.24 x10 6/uL (ref 4.20–5.90)
RDW: 13 % (ref 11–15)
WBC: 6.61 x10 3/uL (ref 3.10–9.50)
nRBC %: 0 /100{WBCs} (ref <=0.0)

## 2024-05-02 LAB — HEMOGLOBIN A1C
Average Estimated Glucose: 171.4 mg/dL
Hemoglobin A1C: 7.6 % — ABNORMAL HIGH (ref 4.6–5.6)

## 2024-05-02 LAB — LIPID PANEL
Cholesterol / HDL Ratio: 3.1 {index}
Cholesterol: 107 mg/dL (ref <=199)
HDL: 34 mg/dL — ABNORMAL LOW (ref >=40)
LDL Calculated: 58 mg/dL (ref 0–99)
Triglycerides: 75 mg/dL (ref 34–149)
VLDL Calculated: 15 mg/dL (ref 10–40)

## 2024-05-02 LAB — URINE MICROALBUMIN, RANDOM
Urine Creatinine: 161 mg/dL
Urine Microalbumin/Creatinine Ratio: 14 ug/mg (ref <30)
Urine Microalbumin: 22 ug/mL (ref 0.0–30.0)

## 2024-05-02 LAB — VITAMIN D, 25 OH, TOTAL: Vitamin D 25-OH, Total: 26 ng/mL — ABNORMAL LOW (ref 30–100)

## 2024-05-02 LAB — TSH: TSH: 2.76 u[IU]/mL (ref 0.35–4.94)

## 2024-05-11 ENCOUNTER — Other Ambulatory Visit (INDEPENDENT_AMBULATORY_CARE_PROVIDER_SITE_OTHER): Payer: Self-pay | Admitting: General Practice

## 2024-05-12 NOTE — Telephone Encounter (Signed)
 S/w pt, he says his bottle was lost but is now found. No refill needed at this moment.

## 2024-10-13 ENCOUNTER — Other Ambulatory Visit (INDEPENDENT_AMBULATORY_CARE_PROVIDER_SITE_OTHER): Payer: Self-pay | Admitting: Cardiovascular Disease

## 2024-10-13 ENCOUNTER — Other Ambulatory Visit (INDEPENDENT_AMBULATORY_CARE_PROVIDER_SITE_OTHER): Payer: Self-pay | Admitting: General Practice

## 2024-10-13 MED ORDER — ROSUVASTATIN CALCIUM 40 MG PO TABS
40.0000 mg | ORAL_TABLET | Freq: Every day | ORAL | 0 refills | Status: AC
Start: 2024-10-13 — End: ?

## 2024-10-13 MED ORDER — METOPROLOL TARTRATE 25 MG PO TABS
12.5000 mg | ORAL_TABLET | Freq: Two times a day (BID) | ORAL | 0 refills | Status: AC
Start: 2024-10-13 — End: ?

## 2024-10-13 MED ORDER — LOSARTAN POTASSIUM 100 MG PO TABS: 100.0000 mg | ORAL_TABLET | Freq: Every day | ORAL | 0 refills | Status: DC

## 2024-12-10 ENCOUNTER — Other Ambulatory Visit (INDEPENDENT_AMBULATORY_CARE_PROVIDER_SITE_OTHER): Payer: Self-pay | Admitting: General Practice

## 2024-12-10 DIAGNOSIS — I2510 Atherosclerotic heart disease of native coronary artery without angina pectoris: Secondary | ICD-10-CM

## 2024-12-10 DIAGNOSIS — I25119 Atherosclerotic heart disease of native coronary artery with unspecified angina pectoris: Secondary | ICD-10-CM

## 2024-12-31 ENCOUNTER — Other Ambulatory Visit (INDEPENDENT_AMBULATORY_CARE_PROVIDER_SITE_OTHER): Payer: Self-pay | Admitting: General Practice

## 2024-12-31 DIAGNOSIS — I25119 Atherosclerotic heart disease of native coronary artery with unspecified angina pectoris: Secondary | ICD-10-CM

## 2025-01-03 ENCOUNTER — Other Ambulatory Visit (INDEPENDENT_AMBULATORY_CARE_PROVIDER_SITE_OTHER): Payer: Self-pay

## 2025-01-03 DIAGNOSIS — I25119 Atherosclerotic heart disease of native coronary artery with unspecified angina pectoris: Secondary | ICD-10-CM

## 2025-01-03 MED ORDER — EZETIMIBE 10 MG PO TABS: 10.0000 mg | ORAL_TABLET | Freq: Every day | ORAL | 0 refills | Status: AC

## 2025-01-03 MED ORDER — LOSARTAN POTASSIUM 100 MG PO TABS: 100.0000 mg | ORAL_TABLET | Freq: Every day | ORAL | 0 refills | Status: AC

## 2025-01-03 NOTE — Telephone Encounter (Signed)
 Surescripts expired. Sending refill in new encounter.

## 2025-07-27 ENCOUNTER — Ambulatory Visit (INDEPENDENT_AMBULATORY_CARE_PROVIDER_SITE_OTHER): Admitting: Cardiovascular Disease
